# Patient Record
Sex: Female | Born: 1980 | Race: White | Hispanic: No | Marital: Single | Smoking: Heavy tobacco smoker
Health system: Southern US, Community
[De-identification: ages and names within clinical notes are randomized; demographics above are authoritative.]

## PROBLEM LIST (undated history)

## (undated) DIAGNOSIS — E512 Wernicke's encephalopathy: Secondary | ICD-10-CM

## (undated) DIAGNOSIS — M609 Myositis, unspecified: Secondary | ICD-10-CM

## (undated) DIAGNOSIS — B1920 Unspecified viral hepatitis C without hepatic coma: Secondary | ICD-10-CM

## (undated) DIAGNOSIS — I38 Endocarditis, valve unspecified: Secondary | ICD-10-CM

## (undated) DIAGNOSIS — F1910 Other psychoactive substance abuse, uncomplicated: Secondary | ICD-10-CM

## (undated) DIAGNOSIS — Z8543 Personal history of malignant neoplasm of ovary: Secondary | ICD-10-CM

## (undated) HISTORY — PX: TUBAL LIGATION: SHX77

## (undated) HISTORY — PX: TONSILLECTOMY: SUR1361

---
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
## 2005-01-19 ENCOUNTER — Ambulatory Visit: Payer: Self-pay | Admitting: Infectious Diseases

## 2015-03-31 ENCOUNTER — Encounter (HOSPITAL_BASED_OUTPATIENT_CLINIC_OR_DEPARTMENT_OTHER): Payer: Self-pay

## 2015-03-31 ENCOUNTER — Emergency Department (HOSPITAL_BASED_OUTPATIENT_CLINIC_OR_DEPARTMENT_OTHER): Payer: Self-pay

## 2015-03-31 ENCOUNTER — Emergency Department (HOSPITAL_BASED_OUTPATIENT_CLINIC_OR_DEPARTMENT_OTHER)
Admission: EM | Admit: 2015-03-31 | Discharge: 2015-03-31 | Disposition: A | Discharge status: Home / Self Care | Payer: Self-pay | Attending: Emergency Medicine | Admitting: Emergency Medicine

## 2015-03-31 DIAGNOSIS — Y9289 Other specified places as the place of occurrence of the external cause: Secondary | ICD-10-CM | POA: Insufficient documentation

## 2015-03-31 DIAGNOSIS — S63602A Unspecified sprain of left thumb, initial encounter: Secondary | ICD-10-CM | POA: Insufficient documentation

## 2015-03-31 DIAGNOSIS — Y998 Other external cause status: Secondary | ICD-10-CM | POA: Insufficient documentation

## 2015-03-31 DIAGNOSIS — Y9389 Activity, other specified: Secondary | ICD-10-CM | POA: Insufficient documentation

## 2015-03-31 DIAGNOSIS — W228XXA Striking against or struck by other objects, initial encounter: Secondary | ICD-10-CM | POA: Insufficient documentation

## 2015-03-31 DIAGNOSIS — Z720 Tobacco use: Secondary | ICD-10-CM | POA: Insufficient documentation

## 2015-03-31 DIAGNOSIS — S63619A Unspecified sprain of unspecified finger, initial encounter: Secondary | ICD-10-CM

## 2015-03-31 MED ORDER — CYCLOBENZAPRINE HCL 10 MG PO TABS: 10.0000 mg | ORAL_TABLET | Freq: Two times a day (BID) | ORAL | Status: DC | PRN

## 2015-03-31 NOTE — ED Notes (Signed)
Pt reports left thumb and hand injury x2 days ago while moving furniture, reports a dresser hit it.

## 2015-03-31 NOTE — ED Notes (Signed)
Capillary refill WNL of left thumb and other digits

## 2015-03-31 NOTE — ED Provider Notes (Signed)
CSN: [id number]     Arrival date & time 03/31/15  1525 History   First MD Initiated Contact with Patient 03/31/15 1705     Chief Complaint  Patient presents with  . Finger Injury     (Consider location/radiation/quality/duration/timing/severity/associated sxs/prior Treatment) HPI  Blood pressure 105/65, pulse 81, temperature 98.4 F (36.9 C), temperature source Oral, resp. rate 16, height  (1.651 m), weight 117 lb (53.071 kg), last menstrual period 03/04/2015, SpO2 100 %.  Mackenzie Hughes is a 34 y.o. female complaining of severe, 8 out of 10 left thumb pain after patient impacted the hand in between the pouch and a wall 2 days ago. Patient states that she is right-hand dominant, she's been taking over-the-counter pain medications at home with little relief, states that she is losing sleep because the pain is so severe.  History reviewed. No pertinent past medical history. Past Surgical History  Procedure Laterality Date  . Tonsillectomy     History reviewed. No pertinent family history. History  Substance Use Topics  . Smoking status: Light Tobacco Smoker  . Smokeless tobacco: Not on file  . Alcohol Use: No   OB History    No data available     Review of Systems  10 systems reviewed and found to be negative, except as noted in the HPI.  Allergies  Toradol and Tramadol  Home Medications   Prior to Admission medications   Not on File   BP 165/110 mmHg  Pulse 85  Temp(Src) 98.4 F (36.9 C) (Oral)  Resp 16  Ht  (1.651 m)  Wt 117 lb (53.071 kg)  BMI 19.47 kg/m2  SpO2 99%  LMP 03/04/2015 Physical Exam  Constitutional: She is oriented to person, place, and time. She appears well-developed and well-nourished. No distress.  HENT:  Head: Normocephalic.  Eyes: Conjunctivae and EOM are normal.  Cardiovascular: Normal rate.   Pulmonary/Chest: Effort normal. No stridor.  Musculoskeletal: Normal range of motion. She exhibits tenderness. She exhibits no  edema.  Left thumb/hand with no deformity, no lacerations, ecchymoses. Full range of motion to the thumb, no significant swelling. Patient is diffusely tender to palpation along the thumb and first metacarpal. Distally neurovascular intact with cap refill less than 2 seconds and ability to differentiate between pinprick and light touch.  Neurological: She is alert and oriented to person, place, and time.  Psychiatric: She has a normal mood and affect.  Nursing note and vitals reviewed.   ED Course  Procedures (including critical care time) Labs Review Labs Reviewed - No data to display  Imaging Review Dg Hand Complete Left  03/31/2015   CLINICAL DATA:  Pain. Trauma to thumb several days ago ; thumb was caught between wall and dresser  EXAM: LEFT HAND - COMPLETE 3+ VIEW  COMPARISON:  None.  FINDINGS: Frontal, oblique, and lateral views obtained. There is no fracture or dislocation. Joint spaces appear intact. No erosive change.  IMPRESSION: No fracture or dislocation.  No appreciable arthropathy.   Electronically Signed   By: Bretta Bang III M.D.   On: 03/31/2015 15:53     EKG Interpretation None      MDM   Final diagnoses:  Finger sprain, initial encounter    Filed Vitals:   03/31/15 1529 03/31/15 1717  BP: 165/110 105/65  Pulse: 85 81  Temp: 98.4 F (36.9 C)   TempSrc: Oral   Resp: 16 16  Height:  (1.651 m)   Weight: 117 lb (53.071 kg)  SpO2: 99% 100%    Mackenzie Hughes is a pleasant 34 y.o. female presenting with Left thumb pain after patient impacted the hand in between the couch and wall several days ago. No overlying lacerations. Neurovascularly intact with mild tenderness to palpation. X-rays negative. Will treat with rest, ice, compression elevation, NSAIDS. Patient given wrist splint and an orthopedic follow-up.   Evaluation does not show pathology that would require ongoing emergent intervention or inpatient treatment. Pt is hemodynamically stable and  mentating appropriately. Discussed findings and plan with patient/guardian, who agrees with care plan. All questions answered. Return precautions discussed and outpatient follow up given.   Discharge Medication List as of 03/31/2015  5:13 PM    START taking these medications   Details  cyclobenzaprine (FLEXERIL) 10 MG tablet Take 1 tablet (10 mg total) by mouth 2 (two) times daily as needed for muscle spasms., Starting 03/31/2015, Until Discontinued, [employer], PA-C 04/01/15 0041  Jerelyn Scott, MD 04/02/15 [phone]

## 2015-03-31 NOTE — ED Notes (Signed)
injuried left hand/thumb Friday afternoon, states hand became lodged between a piece of furniture and the wall, mild swelling noted to injury site

## 2015-03-31 NOTE — Discharge Instructions (Signed)
Rest, Ice intermittently (in the first 24-48 hours), Gentle compression with an Ace wrap, and elevate (Limb above the level of the heart)  For pain control you may take up to 800mg  of ibuprofen (that is usually 4 over the counter pills)  3 times a day (take with food) and acetaminophen 975mg  (this is 3 over the counter pills) four times a day. Do not drink alcohol or combine with other medications that have acetaminophen as an ingredient (Read the labels!).    For breakthrough pain you may take Flexeril. Do not drink alcohol, drive or operate heavy machinery when taking Flexeril.  Do not hesitate to return to the emergency room for any new, worsening or concerning symptoms.  Please obtain primary care using resource guide below. Let them know that you were seen in the emergency room and that they will need to obtain records for further outpatient management.   Finger Sprain A finger sprain is a tear in one of the strong, fibrous tissues that connect the bones (ligaments) in your finger. The severity of the sprain depends on how much of the ligament is torn. The tear can be either partial or complete. CAUSES  Often, sprains are a result of a fall or accident. If you extend your hands to catch an object or to protect yourself, the force of the impact causes the fibers of your ligament to stretch too much. This excess tension causes the fibers of your ligament to tear. SYMPTOMS  You may have some loss of motion in your finger. Other symptoms include:  Bruising.  Tenderness.  Swelling. DIAGNOSIS  In order to diagnose finger sprain, your caregiver will physically examine your finger or thumb to determine how torn the ligament is. Your caregiver may also suggest an X-ray exam of your finger to make sure no bones are broken. TREATMENT  If your ligament is only partially torn, treatment usually involves keeping the finger in a fixed position (immobilization) for a short period. To do this, your  caregiver will apply a bandage, cast, or splint to keep your finger from moving until it heals. For a partially torn ligament, the healing process usually takes 2 to 3 weeks. If your ligament is completely torn, you may need surgery to reconnect the ligament to the bone. After surgery a cast or splint will be applied and will need to stay on your finger or thumb for 4 to 6 weeks while your ligament heals. HOME CARE INSTRUCTIONS  Keep your injured finger elevated, when possible, to decrease swelling.  To ease pain and swelling, apply ice to your joint twice a day, for 2 to 3 days:  Put ice in a plastic bag.  Place a towel between your skin and the bag.  Leave the ice on for 15 minutes.  Only take over-the-counter or prescription medicine for pain as directed by your caregiver.  Do not wear rings on your injured finger.  Do not leave your finger unprotected until pain and stiffness go away (usually 3 to 4 weeks).  Do not allow your cast or splint to get wet. Cover your cast or splint with a plastic bag when you shower or bathe. Do not swim.  Your caregiver may suggest special exercises for you to do during your recovery to prevent or limit permanent stiffness. SEEK IMMEDIATE MEDICAL CARE IF:  Your cast or splint becomes damaged.  Your pain becomes worse rather than better. MAKE SURE YOU:  Understand these instructions.  Will watch your condition.  Will get help right away if you are not doing well or get worse. Document Released: 10/01/2004 Document Revised: 11/16/2011 Document Reviewed: 04/27/2011 [facility] Patient Information 2015 [place], [place]. This information is not intended to replace advice given to you by your health care provider. Make sure you discuss any questions you have with your health care provider.   Emergency Department Resource Guide 1) Find a Doctor and Pay Out of Pocket Although you won't have to find out who is covered by your insurance plan, it is a good  idea to ask around and get recommendations. You will then need to call the office and see if the doctor you have chosen will accept you as a new patient and what types of options they offer for patients who are self-pay. Some doctors offer discounts or will set up payment plans for their patients who do not have insurance, but you will need to ask so you aren't surprised when you get to your appointment.  2) Contact Your Local Health Department Not all health departments have doctors that can see patients for sick visits, but many do, so it is worth a call to see if yours does. If you don't know where your local health department is, you can check in your phone book. The CDC also has a tool to help you locate your state's health department, and many state websites also have listings of all of their local health departments.  3) Find a Walk-in Clinic If your illness is not likely to be very severe or complicated, you may want to try a walk in clinic. These are popping up all over the country in pharmacies, drugstores, and shopping centers. They're usually staffed by nurse practitioners or physician assistants that have been trained to treat common illnesses and complaints. They're usually fairly quick and inexpensive. However, if you have serious medical issues or chronic medical problems, these are probably not your best option.  No Primary Care Doctor: - Call Health Connect at  [phone] - they can help you locate a primary care doctor that  accepts your insurance, provides certain services, etc. - Physician Referral Service- [phone]  Chronic Pain Problems: Organization         Address  Phone   Notes  [facility]  [phone] Patients need to be referred by their primary care doctor.   Medication Assistance: Organization         Address  Phone   Notes  [facility] Medication [facility] [address] [place] [place], [place] [address] [phone]  --Must be a resident of [facility] -- Must have NO insurance coverage whatsoever (no Medicaid/ Medicare, etc.) -- The pt. MUST have a primary care doctor that directs their care regularly and follows them in the community   [facility]ist  [phone]   [employer]  [phone]    Agencies that provide inexpensive medical care: Organization         Address  Phone   Notes  [facility]  [phone]   [facility]    [phone]   [facility] [address] [place], [place] [address] [phone]   [facility] [address], [place] [phone]   Planned Parenthood    [phone]   [facility]    [phone]   [facility]  [address], [place] Phone:  [phone],  Fax:  [phone] Hours of Operation:  9 am - 6 pm, M-F.  Also accepts Medicaid/Medicare and self-pay.  [facility]  [address], [place] Phone: [phone], Fax: [phone]. Hours of Operation:  8:30 am - 5:30 pm, M-F.  Also accepts Medicaid and self-pay.  [facility] [place] [address] [place] Phone: [phone]   [facility] [address] Natasha Bence [place], [place] [phone], Ext. [phone] Mondays & Thursdays: 7-9 AM.  First 15 patients are seen on a first come, first serve basis.    Medicaid-accepting [facility] Providers:  Organization         Address  Phone   Notes  [facility] [address], [place] [phone] Also accepts self-pay patients.  [facility] [address] Laurell Josephs [place], [place]  [phone]   [facility] [address], [place] [phone]   [facility] [address] [place] [phone]   Renaye Rakers [address], [place]   [phone] Only  accepts [place] Access [place] patients after they have their name applied to their card.   Self-Pay (no insurance) in [facility]:  Organization         Address  Phone   Notes  Sickle Cell Patients, [facility] Internal Medicine [address] [place], [place] [phone]   [facility] [address] [place], [place] [phone]   [facility] [place]  [address], [place] [phone]   [facility]/Dr. Osei-Bonsu  [address], [place] or [address], [place] [phone] Phone number for both [place] and [place] locations is the same.  [facility] [address] [place] [phone]   [facility] [address], [place] or [address] [phone]   [facility] [address] [place] [phone], phone; [phone], fax Sees patients 1st and 3rd Saturday of every month.  Must not qualify for public or private insurance (i.e. Medicaid, Medicare, [place] Health Choice, Veterans' Benefits)  Household income should be no more than 200% of the poverty level The clinic cannot treat you if you are pregnant or think you are pregnant  Sexually transmitted diseases are not treated at the clinic.    Dental Care: Organization         Address  Phone  Notes  [facility] of [facility] [address] [place], [place] [phone] Accepts children up to age 64 who are enrolled in [place] or [place] Health Choice; pregnant women with a Medicaid card; and children who have applied for Medicaid or [place] Health Choice, but were declined, whose parents can pay a reduced fee at time of service.  [facility] Department of [facility]  [address], [place] [phone] Accepts children up to age 17 who are enrolled in [place] or [place] Health Choice; pregnant  women with a Medicaid card; and children who have applied for Medicaid or [place] Health Choice, but were declined, whose parents can pay a reduced fee at time of service.  [facility]  [address] [place], [place] [phone] Patients are seen by appointment only. Walk-ins are not  accepted. [facility] will see patients 11 years of age and older. Monday - Tuesday (8am-5pm) Most Wednesdays (8:30-5pm) $30 per visit, cash only  [facility] Adult Dental Access [facility]  [address], [facility] [phone] Patients are seen by appointment only. Walk-ins are not accepted. [facility] will see patients 14 years of age and older. One Wednesday Evening (Monthly: Volunteer Based).  $30 per visit, cash only  [employer]  [phone] for adults; Children under age 49, call Graduate Pediatric Dentistry at [phone]. Children aged 32-14, please call [phone] to request a pediatric application.  Dental services are provided in all areas of dental care including fillings, crowns and bridges, complete and partial dentures, implants, gum treatment, root canals, and extractions. Preventive care is also provided. Treatment is provided to both adults and children. Patients are selected via a lottery and there is often a waiting list.   [facility] [address], [place]  [phone] [email or web address]   [facility] [address] [place], [place] [phone], Ext. [phone] Second and Fourth Thursday of each month, opens at 6:30 AM; Clinic ends at 9 AM.  Patients are seen on a first-come first-served basis, and a limited number are seen during each clinic.   [facility]  [address] Ether Griffins [place], [place] [phone]   Eligibility Requirements You must have lived in [place], [place], or [place] counties for at least the last three months.   You cannot be eligible for state or federal sponsored  [place], including [employer], [place], or [employer].   You generally cannot be eligible for healthcare insurance through your employer.    How to apply: Eligibility screenings are held every Tuesday and Wednesday afternoon from 1:00 pm until 4:00 pm. You do not need an appointment for the interview!  [facility] [address], [place], [place] [phone]   [facility] Health Department  [phone]   [facility] Health Department  [phone]   [facility] Health Department  [phone]    Behavioral Health Resources in the Community: Intensive Outpatient Programs Organization         Address  Phone  Notes  [facility] Services [address] [place], [place] [phone]   [facility] [address] [place], [place] [phone]   [facility] [address], [place], [place]  [phone]   [facility] Mental Health [address]  [place], [place] [phone] or [phone]   Substance Abuse Resources Organization         Address  Phone  Notes  [facility] Drug Services  [phone]   [facility]  [phone]   [facility] [place]  [phone]   Floydene Flock  [phone]   Residential & Outpatient Substance Abuse Program  [phone]   Psychological Services Organization         Address  Phone  Notes  [facility]  [phone]   [facility]  [phone]   [facility] Mental Health [address], [place] [phone] or [phone]    Mobile Crisis Teams Organization         Address  Phone  Notes  [facility], Mobile Crisis Care Unit  [phone]   [facility]  [address] [place], [place] [phone]   [facility] [address] [place] [phone]    Self-Help/Support Groups Organization  Address  Phone              Notes  [facility] [place] - variety of support groups  336- I7437963 Call for more information  Narcotics Anonymous (NA), [facility] [address], [employer] [place]  2 meetings at this location   Statistician         Address  Phone  Notes  [facility] [address],    [place]  [phone]   [facility]  [address], [place] [address], [place], [place] [phone]   [facility] Treatment Facility [address] [place], [place] [phone] Admissions: 8am-3pm M-F  [facility] [address],    [place], [place] [phone]   [facility] [address] [place], [place], [place] [phone]   [facility] [address],  [place], [place] [phone]   [facility] - Intensive Outpatient [address] Laurell Josephs [address], [place], [place] [phone]   [facility] ([facility] [address] [place]  [place], [place] [phone] or [phone]   [facility] ([facility]) [address] [place], [place] [phone] Accepts Medicaid  [facility] [address],  [place] [phone] Substance Abuse/Addiction Treatment   [facility] Organization         Address  Phone  Notes  [facility]  [phone]   Angie Fava, PhD [address] Ervin Knack [place], [place]   [phone] or [phone]   [facility]   [address] [place], [place] [phone]   [facility] [address], [place], [place] [phone] Insurance/Medicaid/sponsorship through [facility] and Families [address]                                    [place], [place] [phone] Therapy/tele-psych/case  [facility] [address], [place] [phone]    Dr. Lolly Mustache  [phone]   [facility]  [employer] [facility] Dept. 1) [address], [place] 2) [address] [place] 3)  [address], [place] [phone]  [phone]   [facility] Child Abuse Hotline 412-267-8258 or [phone] (After Hours)

## 2015-03-31 NOTE — ED Notes (Signed)
Comfort measures provided with ice pack and elevation to injury site

## 2016-01-20 ENCOUNTER — Encounter (HOSPITAL_BASED_OUTPATIENT_CLINIC_OR_DEPARTMENT_OTHER): Payer: Self-pay | Admitting: *Deleted

## 2016-01-20 DIAGNOSIS — L0231 Cutaneous abscess of buttock: Secondary | ICD-10-CM | POA: Insufficient documentation

## 2016-01-20 DIAGNOSIS — F17200 Nicotine dependence, unspecified, uncomplicated: Secondary | ICD-10-CM | POA: Insufficient documentation

## 2016-01-20 NOTE — ED Notes (Signed)
Pt c/o abscess to left buttocks x 3 days

## 2016-01-21 ENCOUNTER — Emergency Department (HOSPITAL_BASED_OUTPATIENT_CLINIC_OR_DEPARTMENT_OTHER)
Admission: EM | Admit: 2016-01-21 | Discharge: 2016-01-21 | Disposition: A | Discharge status: Home / Self Care | Payer: Self-pay | Attending: Emergency Medicine | Admitting: Emergency Medicine

## 2016-01-21 DIAGNOSIS — L0231 Cutaneous abscess of buttock: Secondary | ICD-10-CM

## 2016-01-21 LAB — CBC WITH DIFFERENTIAL/PLATELET
Basophils Absolute: 0 10*3/uL (ref 0.0–0.1)
Basophils Relative: 0 %
EOS ABS: 0.1 10*3/uL (ref 0.0–0.7)
EOS PCT: 1 %
HCT: 32.3 % — ABNORMAL LOW (ref 36.0–46.0)
Hemoglobin: 11 g/dL — ABNORMAL LOW (ref 12.0–15.0)
LYMPHS ABS: 1 10*3/uL (ref 0.7–4.0)
Lymphocytes Relative: 14 %
MCH: 30.5 pg (ref 26.0–34.0)
MCHC: 34.1 g/dL (ref 30.0–36.0)
MCV: 89.5 fL (ref 78.0–100.0)
Monocytes Absolute: 0.5 10*3/uL (ref 0.1–1.0)
Monocytes Relative: 7 %
Neutro Abs: 5.5 10*3/uL (ref 1.7–7.7)
Neutrophils Relative %: 78 %
Platelets: 141 10*3/uL — ABNORMAL LOW (ref 150–400)
RBC: 3.61 MIL/uL — ABNORMAL LOW (ref 3.87–5.11)
RDW: 14.7 % (ref 11.5–15.5)
WBC: 7 10*3/uL (ref 4.0–10.5)

## 2016-01-21 LAB — BASIC METABOLIC PANEL
Anion gap: 8 (ref 5–15)
BUN: 17 mg/dL (ref 6–20)
CALCIUM: 8.7 mg/dL — AB (ref 8.9–10.3)
CO2: 25 mmol/L (ref 22–32)
Chloride: 99 mmol/L — ABNORMAL LOW (ref 101–111)
Creatinine, Ser: 0.63 mg/dL (ref 0.44–1.00)
GFR calc Af Amer: >60 mL/min (ref >60)
GFR calc non Af Amer: >60 mL/min (ref >60)
GLUCOSE: 132 mg/dL — AB (ref 65–99)
Potassium: 3.5 mmol/L (ref 3.5–5.1)
SODIUM: 132 mmol/L — AB (ref 135–145)

## 2016-01-21 LAB — I-STAT CG4 LACTIC ACID, ED: Lactic Acid, Venous: 1.03 mmol/L (ref 0.5–2.0)

## 2016-01-21 MED ORDER — HYDROMORPHONE HCL 1 MG/ML IJ SOLN
0.5000 mg | Freq: Once | INTRAMUSCULAR | Status: AC
  Administered 2016-01-21: 0.5 mg via INTRAVENOUS
  Filled 2016-01-21: qty 1

## 2016-01-21 MED ORDER — SODIUM CHLORIDE 0.9 % IV BOLUS (SEPSIS)
2000.0000 mL | Freq: Once | INTRAVENOUS | Status: AC
  Administered 2016-01-21: 2000 mL via INTRAVENOUS

## 2016-01-21 MED ORDER — SULFAMETHOXAZOLE-TRIMETHOPRIM 800-160 MG PO TABS: 1.0000 | ORAL_TABLET | Freq: Two times a day (BID) | ORAL | Status: DC

## 2016-01-21 MED ORDER — LIDOCAINE-EPINEPHRINE 2 %-1:100000 IJ SOLN
20.0000 mL | Freq: Once | INTRAMUSCULAR | Status: AC
  Administered 2016-01-21: 20 mL via INTRADERMAL
  Filled 2016-01-21: qty 1

## 2016-01-21 NOTE — ED Notes (Signed)
Per VO of PA at bedside, we will not draw the second blood culture at this time.  Will wait for the other results and decide whether to go ahead with the second blood culture.

## 2016-01-21 NOTE — ED Notes (Signed)
PA at bedside doing abscess I&D.

## 2016-01-21 NOTE — ED Provider Notes (Signed)
CSN: [id number]     Arrival date & time 01/20/16  2333 History   First MD Initiated Contact with Patient 01/21/16 0010     Chief Complaint  Patient presents with  . Abscess     (Consider location/radiation/quality/duration/timing/severity/associated sxs/prior Treatment) HPI   Blood pressure 102/64, pulse 130, temperature 99.6 F (37.6 C), resp. rate 16, height 5\' 5"  (1.651 m), weight 49.896 kg, last menstrual period 12/25/2015, SpO2 96 %.  [name] is a 35 y.o. female complaining of painful swelling to left buttocks onset 3 days ago. No known trauma to the area. She reports tactile fever and chills with no nausea or vomiting. Pain is severe, 10 out of 10 and exacerbated by palpation, no alleviating factors identified.  History reviewed. No pertinent past medical history. Past Surgical History  Procedure Laterality Date  . Tonsillectomy    . Tubal ligation     History reviewed. No pertinent family history. Social History  Substance Use Topics  . Smoking status: Heavy Tobacco Smoker -- 0.50 packs/day  . Smokeless tobacco: None  . Alcohol Use: No   OB History    No data available     Review of Systems  10 systems reviewed and found to be negative, except as noted in the HPI.   Allergies  Toradol and Tramadol  Home Medications   Prior to Admission medications   Medication Sig Start Date End Date Taking? Authorizing Provider  sulfamethoxazole-trimethoprim (BACTRIM DS) 800-160 MG tablet Take 1 tablet by mouth 2 (two) times daily. 01/21/16   Krystalynn Ridgeway, PA-C   BP 91/56 mmHg  Pulse 105  Temp(Src) 99.6 F (37.6 C)  Resp 16  Ht 5\' 5"  (1.651 m)  Wt 49.896 kg  BMI 18.31 kg/m2  SpO2 100%  LMP 12/25/2015 Physical Exam  Constitutional: She is oriented to person, place, and time. She appears well-developed. No distress.  Cachectic, appears much older than stated age  HENT:  Head: Normocephalic.  Eyes: Conjunctivae and EOM are normal.  Cardiovascular:  Regular rhythm and intact distal pulses.   No murmur heard. Tachycardic  Pulmonary/Chest: Effort normal and breath sounds normal. No stridor.  Abdominal: Soft. Bowel sounds are normal.  Genitourinary:     Musculoskeletal: Normal range of motion.  Neurological: She is alert and oriented to person, place, and time.  Skin:  Track marks to right volar forearm, no overt infection  Psychiatric: She has a normal mood and affect.  Nursing note and vitals reviewed.   ED Course  .Marland Kitchen.Incision and Drainage Date/Time: 01/21/2016 1:08 AM Performed by: Wynetta EmeryPISCIOTTA, Leasia Swann Authorized by: Wynetta EmeryPISCIOTTA, Dashea Mcmullan Consent: Verbal consent obtained. Risks and benefits: risks, benefits and alternatives were discussed Consent given by: patient Patient identity confirmed: verbally with patient Type: abscess Body area: trunk Local anesthetic: lidocaine 2% without epinephrine Anesthetic total: 3 ml Patient sedated: no Scalpel size: 11 Incision type: elliptical Incision depth: dermal Drainage: purulent and  bloody Drainage amount: scant Wound treatment: wound left open Packing material: none Patient tolerance: Patient tolerated the procedure well with no immediate complications   (including critical care time) Labs Review Labs Reviewed  CBC WITH DIFFERENTIAL/PLATELET - Abnormal; Notable for the following:    RBC 3.61 (*)    Hemoglobin 11.0 (*)    HCT 32.3 (*)    Platelets 141 (*)    All other components within normal limits  CULTURE, BLOOD (ROUTINE X 2)  CULTURE, BLOOD (ROUTINE X 2)  BASIC METABOLIC PANEL  I-STAT CG4 LACTIC ACID, ED  Imaging Review No results found. I have personally reviewed and evaluated these images and lab results as part of my medical decision-making.   EKG Interpretation None      MDM   Final diagnoses:  Gluteal abscess    Filed Vitals:   01/20/16 2340 01/21/16 0113  BP: 102/64 91/56  Pulse: 130 105  Temp: 99.6 F (37.6 C)   Resp: 16 16  Height:   (1.651 m)   Weight: 49.896 kg   SpO2: 96% 100%    Medications  sodium chloride 0.9 % bolus 2,000 mL (2,000 mLs Intravenous New Bag/Given 01/21/16 0059)  lidocaine-EPINEPHrine (XYLOCAINE W/EPI) 2 %-1:100000 (with pres) injection 20 mL (20 mLs Intradermal Given 01/21/16 0059)  HYDROmorphone (DILAUDID) injection 0.5 mg (0.5 mg Intravenous Given 01/21/16 0100)    Mackenzie Hughes is 35 y.o. female presenting with  Abscess and mild surrounding cellulitis to right gluteus. Does not approach the anal verge. Pt tachycardiac to 130 but not febrile. Will obtain labs, Dowbt sepsis I think she may be withdrawing. Blood work reassuring. Tachycardia has improved to 105. Patient reports improvement in symptoms, counseled her on sitz bath and return precautions.  Given this patient's reassuring lab work and the difficulty in obtaining a second blood culture have given verbal okay for single blood culture to be drawn.  Evaluation does not show pathology that would require ongoing emergent intervention or inpatient treatment. Pt is hemodynamically stable and mentating appropriately. Discussed findings and plan with patient/guardian, who agrees with care plan. All questions answered. Return precautions discussed and outpatient follow up given.   New Prescriptions   SULFAMETHOXAZOLE-TRIMETHOPRIM (BACTRIM DS) 800-160 MG TABLET    Take 1 tablet by mouth 2 (two) times daily.         Wynetta Emery, PA-C 01/21/16 0120  Wynetta Emery, PA-C 01/21/16 0121  Cy Blamer, MD 01/21/16 8295

## 2016-01-21 NOTE — Discharge Instructions (Signed)
If you see worsening signs of infection (warmth, redness, tenderness, pus, sharp increase in pain, fever, red streaking) immediately return to the emergency department.  Please follow with your primary care doctor in the next 2 days for a check-up. They must obtain records for further management.   Do not hesitate to return to the Emergency Department for any new, worsening or concerning symptoms.    Incision and Drainage Incision and drainage is a procedure in which a sac-like structure (cystic structure) is opened and drained. The area to be drained usually contains material such as pus, fluid, or blood.  LET YOUR CAREGIVER KNOW ABOUT:   Allergies to medicine.  Medicines taken, including vitamins, herbs, eyedrops, over-the-counter medicines, and creams.  Use of steroids (by mouth or creams).  Previous problems with anesthetics or numbing medicines.  History of bleeding problems or blood clots.  Previous surgery.  Other health problems, including diabetes and kidney problems.  Possibility of pregnancy, if this applies. RISKS AND COMPLICATIONS  Pain.  Bleeding.  Scarring.  Infection. BEFORE THE PROCEDURE  You may need to have an ultrasound or other imaging tests to see how large or deep your cystic structure is. Blood tests may also be used to determine if you have an infection or how severe the infection is. You may need to have a tetanus shot. PROCEDURE  The affected area is cleaned with a cleaning fluid. The cyst area will then be numbed with a medicine (local anesthetic). A small incision will be made in the cystic structure. A syringe or catheter may be used to drain the contents of the cystic structure, or the contents may be squeezed out. The area will then be flushed with a cleansing solution. After cleansing the area, it is often gently packed with a gauze or another wound dressing. Once it is packed, it will be covered with gauze and tape or some other type of wound  dressing. AFTER THE PROCEDURE   Often, you will be allowed to go home right after the procedure.  You may be given antibiotic medicine to prevent or heal an infection.  If the area was packed with gauze or some other wound dressing, you will likely need to come back in 1 to 2 days to get it removed.  The area should heal in about 14 days.   This information is not intended to replace advice given to you by your health care provider. Make sure you discuss any questions you have with your health care provider.   Document Released: 02/17/2001 Document Revised: 02/23/2012 Document Reviewed: 10/19/2011 Elsevier Interactive Patient Education [employer]2016 Elsevier Inc.

## 2016-01-26 LAB — CULTURE, BLOOD (ROUTINE X 2): Culture: NO GROWTH

## 2018-03-04 DIAGNOSIS — A4102 Sepsis due to Methicillin resistant Staphylococcus aureus: Secondary | ICD-10-CM

## 2018-03-04 DIAGNOSIS — E871 Hypo-osmolality and hyponatremia: Secondary | ICD-10-CM

## 2018-03-04 DIAGNOSIS — R509 Fever, unspecified: Secondary | ICD-10-CM

## 2018-03-04 DIAGNOSIS — E46 Unspecified protein-calorie malnutrition: Secondary | ICD-10-CM

## 2018-03-04 DIAGNOSIS — R6521 Severe sepsis with septic shock: Secondary | ICD-10-CM

## 2018-03-04 DIAGNOSIS — R05 Cough: Secondary | ICD-10-CM

## 2018-03-04 DIAGNOSIS — I2690 Septic pulmonary embolism without acute cor pulmonale: Secondary | ICD-10-CM

## 2018-03-04 DIAGNOSIS — F1123 Opioid dependence with withdrawal: Secondary | ICD-10-CM

## 2018-03-04 DIAGNOSIS — M726 Necrotizing fasciitis: Secondary | ICD-10-CM

## 2018-03-06 ENCOUNTER — Inpatient Hospital Stay (HOSPITAL_COMMUNITY)
Admission: AD | Admit: 2018-03-06 | Discharge: 2018-03-07 | DRG: 557 | Discharge status: Against Medical Advice | Payer: Self-pay | Source: Other Acute Inpatient Hospital | Attending: Internal Medicine | Admitting: Internal Medicine

## 2018-03-06 DIAGNOSIS — F17210 Nicotine dependence, cigarettes, uncomplicated: Secondary | ICD-10-CM | POA: Diagnosis present

## 2018-03-06 DIAGNOSIS — Z885 Allergy status to narcotic agent status: Secondary | ICD-10-CM

## 2018-03-06 DIAGNOSIS — F1990 Other psychoactive substance use, unspecified, uncomplicated: Secondary | ICD-10-CM

## 2018-03-06 DIAGNOSIS — A419 Sepsis, unspecified organism: Secondary | ICD-10-CM | POA: Diagnosis present

## 2018-03-06 DIAGNOSIS — I340 Nonrheumatic mitral (valve) insufficiency: Secondary | ICD-10-CM | POA: Diagnosis present

## 2018-03-06 DIAGNOSIS — I2690 Septic pulmonary embolism without acute cor pulmonale: Secondary | ICD-10-CM | POA: Diagnosis present

## 2018-03-06 DIAGNOSIS — M728 Other fibroblastic disorders: Principal | ICD-10-CM | POA: Diagnosis present

## 2018-03-06 DIAGNOSIS — D649 Anemia, unspecified: Secondary | ICD-10-CM | POA: Diagnosis present

## 2018-03-06 DIAGNOSIS — B182 Chronic viral hepatitis C: Secondary | ICD-10-CM | POA: Diagnosis present

## 2018-03-06 DIAGNOSIS — M25512 Pain in left shoulder: Secondary | ICD-10-CM | POA: Diagnosis present

## 2018-03-06 DIAGNOSIS — Z8543 Personal history of malignant neoplasm of ovary: Secondary | ICD-10-CM

## 2018-03-06 DIAGNOSIS — M549 Dorsalgia, unspecified: Secondary | ICD-10-CM | POA: Diagnosis present

## 2018-03-06 DIAGNOSIS — M25511 Pain in right shoulder: Secondary | ICD-10-CM

## 2018-03-06 DIAGNOSIS — I330 Acute and subacute infective endocarditis: Secondary | ICD-10-CM | POA: Diagnosis present

## 2018-03-06 DIAGNOSIS — Z452 Encounter for adjustment and management of vascular access device: Secondary | ICD-10-CM

## 2018-03-06 DIAGNOSIS — E320 Persistent hyperplasia of thymus: Secondary | ICD-10-CM | POA: Diagnosis present

## 2018-03-06 DIAGNOSIS — B9561 Methicillin susceptible Staphylococcus aureus infection as the cause of diseases classified elsewhere: Secondary | ICD-10-CM | POA: Diagnosis present

## 2018-03-06 DIAGNOSIS — Z9089 Acquired absence of other organs: Secondary | ICD-10-CM

## 2018-03-06 DIAGNOSIS — Z5321 Procedure and treatment not carried out due to patient leaving prior to being seen by health care provider: Secondary | ICD-10-CM | POA: Diagnosis present

## 2018-03-06 DIAGNOSIS — I058 Other rheumatic mitral valve diseases: Secondary | ICD-10-CM | POA: Diagnosis present

## 2018-03-06 DIAGNOSIS — G8929 Other chronic pain: Secondary | ICD-10-CM | POA: Diagnosis present

## 2018-03-06 DIAGNOSIS — Z720 Tobacco use: Secondary | ICD-10-CM | POA: Diagnosis present

## 2018-03-06 DIAGNOSIS — R7881 Bacteremia: Secondary | ICD-10-CM

## 2018-03-06 DIAGNOSIS — B9562 Methicillin resistant Staphylococcus aureus infection as the cause of diseases classified elsewhere: Secondary | ICD-10-CM | POA: Diagnosis present

## 2018-03-06 DIAGNOSIS — F1120 Opioid dependence, uncomplicated: Secondary | ICD-10-CM | POA: Diagnosis present

## 2018-03-06 DIAGNOSIS — I059 Rheumatic mitral valve disease, unspecified: Secondary | ICD-10-CM | POA: Diagnosis present

## 2018-03-06 DIAGNOSIS — L8990 Pressure ulcer of unspecified site, unspecified stage: Secondary | ICD-10-CM | POA: Diagnosis present

## 2018-03-06 DIAGNOSIS — Z9851 Tubal ligation status: Secondary | ICD-10-CM

## 2018-03-06 DIAGNOSIS — R6521 Severe sepsis with septic shock: Secondary | ICD-10-CM | POA: Diagnosis present

## 2018-03-06 LAB — COMPREHENSIVE METABOLIC PANEL
ALK PHOS: 92 U/L (ref 38–126)
ALT: 21 U/L (ref 0–44)
AST: 35 U/L (ref 15–41)
Albumin: 2 g/dL — ABNORMAL LOW (ref 3.5–5.0)
Anion gap: 7 (ref 5–15)
BILIRUBIN TOTAL: 0.7 mg/dL (ref 0.3–1.2)
BUN: 9 mg/dL (ref 6–20)
CALCIUM: 7.8 mg/dL — AB (ref 8.9–10.3)
CO2: 24 mmol/L (ref 22–32)
CREATININE: 0.47 mg/dL (ref 0.44–1.00)
Chloride: 109 mmol/L (ref 98–111)
GFR calc non Af Amer: >60 mL/min (ref >60)
Glucose, Bld: 103 mg/dL — ABNORMAL HIGH (ref 70–99)
Potassium: 4.9 mmol/L (ref 3.5–5.1)
SODIUM: 140 mmol/L (ref 135–145)
TOTAL PROTEIN: 5.4 g/dL — AB (ref 6.5–8.1)

## 2018-03-06 LAB — CBC
HEMATOCRIT: 29.8 % — AB (ref 36.0–46.0)
HEMOGLOBIN: 9.6 g/dL — AB (ref 12.0–15.0)
MCH: 28.7 pg (ref 26.0–34.0)
MCHC: 32.2 g/dL (ref 30.0–36.0)
MCV: 89.2 fL (ref 78.0–100.0)
Platelets: 207 10*3/uL (ref 150–400)
RBC: 3.34 MIL/uL — ABNORMAL LOW (ref 3.87–5.11)
RDW: 14.2 % (ref 11.5–15.5)
WBC: 7.2 10*3/uL (ref 4.0–10.5)

## 2018-03-06 MED ORDER — KETOROLAC TROMETHAMINE 30 MG/ML IJ SOLN
30.0000 mg | Freq: Four times a day (QID) | INTRAMUSCULAR | Status: DC | PRN
  Administered 2018-03-06 – 2018-03-07 (×3): 30 mg via INTRAVENOUS
  Filled 2018-03-06 (×3): qty 1

## 2018-03-06 MED ORDER — HYDROMORPHONE HCL 1 MG/ML IJ SOLN
1.0000 mg | INTRAMUSCULAR | Status: DC | PRN
  Administered 2018-03-06 – 2018-03-07 (×5): 1 mg via INTRAVENOUS
  Filled 2018-03-06 (×6): qty 1

## 2018-03-06 MED ORDER — KETOROLAC TROMETHAMINE 30 MG/ML IJ SOLN: 30.0000 mg | Freq: Once | INTRAMUSCULAR | Status: DC

## 2018-03-06 MED ORDER — ONDANSETRON HCL 4 MG PO TABS: 4.0000 mg | ORAL_TABLET | Freq: Four times a day (QID) | ORAL | Status: DC | PRN

## 2018-03-06 MED ORDER — ACETAMINOPHEN 650 MG RE SUPP: 650.0000 mg | Freq: Four times a day (QID) | RECTAL | Status: DC | PRN

## 2018-03-06 MED ORDER — ONDANSETRON HCL 4 MG/2ML IJ SOLN: 4.0000 mg | Freq: Four times a day (QID) | INTRAMUSCULAR | Status: DC | PRN

## 2018-03-06 MED ORDER — POLYETHYLENE GLYCOL 3350 17 G PO PACK: 17.0000 g | PACK | Freq: Every day | ORAL | Status: DC | PRN

## 2018-03-06 MED ORDER — SODIUM CHLORIDE 0.9% FLUSH
10.0000 mL | INTRAVENOUS | Status: DC | PRN
  Administered 2018-03-07: 10 mL
  Filled 2018-03-06: qty 40

## 2018-03-06 MED ORDER — VANCOMYCIN HCL IN DEXTROSE 1-5 GM/200ML-% IV SOLN
1000.0000 mg | Freq: Three times a day (TID) | INTRAVENOUS | Status: DC
  Administered 2018-03-06 – 2018-03-07 (×3): 1000 mg via INTRAVENOUS
  Filled 2018-03-06 (×4): qty 200

## 2018-03-06 MED ORDER — TRAMADOL HCL 50 MG PO TABS
50.0000 mg | ORAL_TABLET | Freq: Once | ORAL | Status: AC
Start: 2018-03-06 — End: 2018-03-06
  Administered 2018-03-06: 50 mg via ORAL
  Filled 2018-03-06: qty 1

## 2018-03-06 MED ORDER — ACETAMINOPHEN 325 MG PO TABS
650.0000 mg | ORAL_TABLET | Freq: Four times a day (QID) | ORAL | Status: DC | PRN
  Administered 2018-03-06 – 2018-03-07 (×2): 650 mg via ORAL
  Filled 2018-03-06 (×2): qty 2

## 2018-03-06 MED ORDER — OXYCODONE HCL 5 MG PO TABS: 5.0000 mg | ORAL_TABLET | ORAL | Status: DC | PRN

## 2018-03-06 NOTE — Progress Notes (Addendum)
PROGRESS NOTE  Mackenzie Hughes SEL:[id number] DOB: 05-Aug-1981 DOA: 03/06/2018 PCP: No primary care provider on file.  HPI  Mackenzie Hughes is a 37 y.o. year old female with medical history significant for IV drug use, heroin), hepatitis C (without treatment), and ovarian cancer (2009) who presented to [facility] on 03/03/18 with 5 to 7 days of reported fever generalized arthralgias and myalgias.  She can only remember her last use of IV heroin about a week prior to admission since then she recalled several days of worsening right arm and chest pain which led to her presenting to [facility].  Per chart review she was brought to the ED with complaints of generalized malaise, fatigue, cough, abdominal pain and altered mental status.  Hospital Course (at [facility]) Per chart review she was found to be hypotensive upon arrival with IV fluids given,  central line placed and patient placed on levophed while managed in the ICU.  Her lab values on admission WBC 17.5, hemoglobin 13.1, platelets 134, sodium 130, potassium 3, BUN 30, creatinine 0.6. She was empirically started on vancomycin and Zosyn for sepsis secondary to infectious endocarditis given IV drug use andseptic pulmonary emboli on imaging.  This was confirmed with small vegetation on tricupsid valve on TEE on 03/04/18.   She presented to [facility] as a transfer on 03/06/2018 due to MRI of right shoulder which showed effusion and inflammation of numerous muscles reported concern for infection/fasciitis with no gas in the soft tissues and no drainable abscess, orthopedic specialist at [facility] evaluated imaging and recommended transfer to tertiary care center given concern for possible developing necrotizing fasciitis.  LAB VALUES PRIOR TO TRANSFER on 6/30  WBC- 9, Hgb 9.1, Plt 142 Sodium 138, potassium 3.3, CO2 28, creatinine 0.4, BUN 7, calcium 7.4, bilirubin 0.3, AST 28, ALT 22, alk phos 91  CRP 127  Hep B antigen negative Hepatitis C  antibody positive  On arrival to [facility] patient was hemodynamically stable complaining mainly of right shoulder pain  Subjective Continues to complain of chest and right shoulder pain   Assessment/Plan: Active Problems:   MRSA bacteremia   Acute infective endocarditis   Anemia   Severe sepsis with septic shock (HCC)   Chronic hepatitis C without hepatic coma (HCC)   IV drug user  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  Positive blood cultures 6/28, 6/29 . Blood cultures form 6/30 pending  Unclear how much this is due to subtherapeutic Vanconycin dosing (vanc trough of 6 on 03/05/18).  Pending blood cultures from 6/30 at [facility].  Likely source of infection vegetation on tricuspid valve as confirmed on TEE on 6/28.  Will consult ID. Vanc per pharmacy  Right shoulder pain, likely infectious fasciitis. Arthrocentesis performed on 6/30 by orthopedics: WBC greater than 20000, no organisms on gram stain culture sensitivity, crystals, Gram stain and cell count pending at outside hospital.  MRI imaging at [facility] suggests infectious/fasciitis process.  In conversation with orthopedics doubt true nec fascitis given patient's stable hemodynamics. More likely infectious fascitis and recommend current management with addition of IV toradol for pain  Back pain, worsening.  Unable to perform MRI at outside hospital.  Will obtain MRI of cervical thoracic and lumbar spine to rule out any osteomyelitis/abscess or other localizing infection to explain persistently positive blood cultures.  She is neurologically intact  Septic shock secondary to above, resolved.  Patient previously required levophed at outside hospital from 6/28 with weaning of infusion on the  morning of 6/30 prior to transfer.  Normal blood pressure currently.  Continue to monitor BP, daily CBC, monitor blood cultures  IV drug use (heroin) last use on 6/27.  HIV negative, hep C positive H.  Patient states she  uses to control chronic pain.  Patient Suboxone was discontinued prior to transfer in favor of IV pain control.  Will continue judiciously here  Acute anemia, likely secondary to infectious process above.  Baseline hemoglobin on admission 13.  Last CBC hemoglobin 9 at outside hospital no signs or symptoms of bleeding currently.  Will repeat CBC in a.m.  Thymic hyperplasia incidental finding-follow-up outpatient  Tobacco abuse continue tobacco cessation.  Continue transdermal nicotine   Code Status: FULL CODE, confirmed on day of transfer   Family Communication: no family at bedside   Disposition Plan: need blood cultures to not be positive, rule out osteomyelitis, ortho to evaluate right shoulder pain      Consultants:  ID, Ortho  Procedures: 6/30>> right shoulder arthrocentesis  6/28>> TEE, Confirmed tricuspid valve vegetation 1. Left ventricular systolic function is hyperdynamic with an estimated EF of >70%. 2. There is trace mitral regurgitation. 3. Mild tricuspid regurgitation present. 4. Small, mobile vegetation attached to the tricuspid valve leaflet, and the vegetation measures 0.3 x 0.7 cm. 5. There is no pericardial effusion.  6. No ASD or PFO noted by doppler.  7. No clot noted in the LAA.  6/27>> left internal jugular central venous catheter  Radiological studies (from [facility]) 6/29>> right shoulder MRI: 1. Abnormal edema and enhancement tracking around the supraspinatus, infraspinatus, subscapularis, teres minor, and lateral pectoral musculature indicating inflammation along regional fascia planes which may well be due to infection/fasciitis. No gas in the soft tissues. I do not see a drainable abscess. 2. There is a glenohumeral joint effusion. However, there is only thin synovial enhancement along this effusion and accordingly I would tend to favor a reactive effusion over septic joint.  6-27>> CT chest/abdomen: 1. No pulmonary embolism. 2.  Numerous variably cavitary poorly marginated pulmonary nodules scattered throughout both lungs, most compatible with septic embolism. 3. Prominent main pulmonary artery, which may indicate pulmonary arterial hypertension. 4. Nonspecific homogeneous triangular soft tissue density in the anterior mediastinum, favor thymic hyperplasia. Recommend attention on follow-up chest CT with IV contrast in 3-6 months. 5. Diffuse periportal edema, nonspecific, which could be due to IV fluid resuscitation, with the differential including ascending cholangitis or acute hepatitis. Otherwise no acute abnormality in the abdomen or pelvis. 6. Emphysema   6/27>> chest x-ray: No active disease.  6/27>> CT head: Normal head CT   Antimicrobials: Anti-infectives (From admission, onward)   None        Cultures: 6/30>> synovial fluid from right shoulder culture/Gram stain pending 6/30>> blood culture: Pending 6/29>> blood culture: 2/2 Gram-positive cocci in clusters 6/28>> blood culture: 2/2 MRSA  6/27>> blood culture: 2/2 MRSA   Telemetry:  DVT prophylaxis:  SCds   Objective: Vitals:   03/06/18 1500  BP: 108/78  Pulse: 79  Resp: 20  Temp: 98.8 F (37.1 C)  TempSrc: Oral  SpO2: 100%   No intake or output data in the 24 hours ending 03/06/18 1811 There were no vitals filed for this visit.  Exam:  Constitutional:frail female, looks older than stated age Eyes: EOMI, anicteric, normal conjunctivae ENMT: Oropharynx with moist mucous membranes, IJ in left neck Cardiovascular: RRR no MRGs, with no peripheral edema Respiratory: Normal respiratory effort on room air, clear breath sounds  Abdomen:  Soft,non-tender, normal bowel sounds MSK/Back: Right shoulder exquisitely tender and warm to touch. Strength intact though limited by pain. No spinal or paraspinal tenderness Skin: No rash ulcers, or lesions. Without skin tenting  Neurologic: Grossly no focal neuro deficit. Moving all  extremities on her how Psychiatric:Appropriate affect, and mood. Mental status AAOx3  Data Reviewed: CBC: No results for input(s): WBC, NEUTROABS, HGB, HCT, MCV, PLT in the last 168 hours. Basic Metabolic Panel: No results for input(s): NA, K, CL, CO2, GLUCOSE, BUN, CREATININE, CALCIUM, MG, PHOS in the last 168 hours. GFR: CrCl cannot be calculated (Patient's most recent lab result is older than the maximum 21 days allowed.). Liver Function Tests: No results for input(s): AST, ALT, ALKPHOS, BILITOT, PROT, ALBUMIN in the last 168 hours. No results for input(s): LIPASE, AMYLASE in the last 168 hours. No results for input(s): AMMONIA in the last 168 hours. Coagulation Profile: No results for input(s): INR, PROTIME in the last 168 hours. Cardiac Enzymes: No results for input(s): CKTOTAL, CKMB, CKMBINDEX, TROPONINI in the last 168 hours. BNP (last 3 results) No results for input(s): PROBNP in the last 8760 hours. HbA1C: No results for input(s): HGBA1C in the last 72 hours. CBG: No results for input(s): GLUCAP in the last 168 hours. Lipid Profile: No results for input(s): CHOL, HDL, LDLCALC, TRIG, CHOLHDL, LDLDIRECT in the last 72 hours. Thyroid Function Tests: No results for input(s): TSH, T4TOTAL, FREET4, T3FREE, THYROIDAB in the last 72 hours. Anemia Panel: No results for input(s): VITAMINB12, FOLATE, FERRITIN, TIBC, IRON, RETICCTPCT in the last 72 hours. Urine analysis: No results found for: COLORURINE, APPEARANCEUR, LABSPEC, PHURINE, GLUCOSEU, HGBUR, BILIRUBINUR, KETONESUR, PROTEINUR, UROBILINOGEN, NITRITE, LEUKOCYTESUR Sepsis Labs: _0 (procalcitonin:4,lacticidven:4)  )No results found for this or any previous visit (from the past 240 hour(s)).    Studies: No results found.  Scheduled Meds:  Continuous Infusions:   LOS: 0 days     Desiree Hane, MD [facility] Pager [phone]  If 7PM-7AM, please contact night-coverage [email or web address] Password  [facility] 03/06/2018, 6:11 PM

## 2018-03-06 NOTE — Consult Note (Signed)
Orthopaedic Trauma Service (OTS) Consult   Patient ID: Mackenzie Hughes MRN: [id number] DOB/AGE: March 08, 1981 37 y.o.  Reason for Consult:Right shoulder pain Referring Physician: Dr. Roberto ScalesShayla Nettey, MD [facility].  HPI: Mackenzie HoldingCarla T Hughes is an 37 y.o. female who is being seen in consultation at the request of Dr. Caleb PoppNettey for evaluation of right shoulder pain. The patient has a history of IV drug abuse along with hepatitis C who [facility] June 27.  She is been having pain from her statement for a couple weeks but it worsened over the last few days.  She says it got really bad yesterday.  The patient was reportedly hypotensive.  She was given fluids and pressors in ICU.  She was started on vancomycin and Zosyn for sepsis.  She underwent a TEE which showed a vegetation on her tricuspid valve.  She developed started complaining of significant shoulder and chest pain.  An MRI was ordered and there is concerning findings for fasciitis.  This can combined with her infectious picture the orthopedist felt that it was warranted for transfer to a higher level of care.  There is concern regarding necrotizing fasciitis.  Earlier this morning she had her shoulder aspirated.  Per report the aspiration had 20,000 white blood cells with no organisms.  Currently she states that the pain is unbearable.  It has spread to her left shoulder now.  She can move her shoulder but she cannot raise it above her head.  She states that it is worsening.  Is no difficulty with shortness of breath.  She does no chest pain in the center of her chest.  No past medical history on file.  Past Surgical History:  Procedure Laterality Date  . TONSILLECTOMY    . TUBAL LIGATION      No family history on file.  Social History:  reports that she has been smoking.  She has been smoking about 0.50 packs per day. She does not have any smokeless tobacco history on file. She reports that she does not drink alcohol  or use drugs.  Allergies:  Allergies  Allergen Reactions  . Toradol [Ketorolac Tromethamine]     GI upset  . Tramadol     GI upset    Medications:  No current facility-administered medications on file prior to encounter.    Current Outpatient Medications on File Prior to Encounter  Medication Sig Dispense Refill  . sulfamethoxazole-trimethoprim (BACTRIM DS) 800-160 MG tablet Take 1 tablet by mouth 2 (two) times daily. 10 tablet 0    ROS: Constitutional: +Fever Vision: No changes in vision ENT: No difficulty swallowing CV: +Chest pain Pulm: No SOB or wheezing GI: No nausea or vomiting GU: No urgency or inability to hold urine Skin: No poor wound healing Neurologic: No numbness or tingling Psychiatric: +Depression Heme: No bruising Allergic: No reaction to medications or food   Exam: Blood pressure 108/78, pulse 79, temperature 98.8 F (37.1 C), temperature source Oral, resp. rate 20, weight 46.5 kg (102 lb 8.2 oz), SpO2 100 %. General:In some discomfort and distress Orientation:Awake, alert and orient Mood and Affect: Not cooperative, head underneath covers Gait: Not assessed   Right upper extremity: Reveals no significant erythema.  There is no blanching erythema.  There is no pitting edema around the shoulder or chest.  She does have tenderness to palpation deep.  She has no bony tenderness.  Gentle range of motion with internal and external rotation at the side is without significant discomfort.  Passive range of motion I am able to get her past 90 degrees of forward elevation.  Actively she has a difficult time secondary to pain she is neurovascularly intact to her arm and hand.  Left upper extremity: Reveals skin without lesions.  There is no significant tenderness palpation she has active range of motion to past 90 degrees with forward elevation.  She is neurovascularly intact.   Medical Decision Making: Imaging: MRI was reviewed from [facility].  There is a  effusion.  No signs of osteomyelitis in her glenoid or proximal femurs.  There is some edema surrounding the muscle and the fascia.  No focal abscess or collection.  Labs:  Results for orders placed or performed during the hospital encounter of 03/06/18 (from the past 24 hour(s))  CBC     Status: Abnormal   Collection Time: 03/06/18  6:44 PM  Result Value Ref Range   WBC 7.2 4.0 - 10.5 K/uL   RBC 3.34 (L) 3.87 - 5.11 MIL/uL   Hemoglobin 9.6 (L) 12.0 - 15.0 g/dL   HCT 91.4 (L) 78.2 - 95.6 %   MCV 89.2 78.0 - 100.0 fL   MCH 28.7 26.0 - 34.0 pg   MCHC 32.2 30.0 - 36.0 g/dL   RDW 21.3 08.6 - 57.8 %   Platelets 207 150 - 400 K/uL  Comprehensive metabolic panel     Status: Abnormal   Collection Time: 03/06/18  6:44 PM  Result Value Ref Range   Sodium 140 135 - 145 mmol/L   Potassium 4.9 3.5 - 5.1 mmol/L   Chloride 109 98 - 111 mmol/L   CO2 24 22 - 32 mmol/L   Glucose, Bld 103 (H) 70 - 99 mg/dL   BUN 9 6 - 20 mg/dL   Creatinine, Ser 4.69 0.44 - 1.00 mg/dL   Calcium 7.8 (L) 8.9 - 10.3 mg/dL   Total Protein 5.4 (L) 6.5 - 8.1 g/dL   Albumin 2.0 (L) 3.5 - 5.0 g/dL   AST 35 15 - 41 U/L   ALT 21 0 - 44 U/L   Alkaline Phosphatase 92 38 - 126 U/L   Total Bilirubin 0.7 0.3 - 1.2 mg/dL   GFR calc non Af Amer >60 >60 mL/min   GFR calc Af Amer >60 >60 mL/min   Anion gap 7 5 - 15    Medical history and chart was reviewed  Assessment/Plan: 37 year old female with IV drug abuse and bacterial endocarditis with right shoulder and chest pain  Currently her exam is not concerning for necrotizing fasciitis.  Is not also not concerning for septic arthritis.  She likely has some element of infectious fasciitis.  There is no focal collection on MRI.  There is no current role for surgical management.  We can follow the cultures from [facility].  I would recommend symptom control with IV antibiotics pain medication and anti-inflammatory medication.  I will continue to follow along to monitor for  improvement.  I recommend infectious disease consult for their opinion   Roby Lofts, MD [facility] [phone] (phone)

## 2018-03-06 NOTE — Progress Notes (Signed)
ANTIBIOTIC CONSULT NOTE - INITIAL  Pharmacy Consult for Vancomycin  Indication: Bacterial endocarditis  Allergies  Allergen Reactions  . Toradol [Ketorolac Tromethamine]     GI upset  . Tramadol     GI upset    Patient Measurements: Weight: 102 lb 8.2 oz (46.5 kg)   Vital Signs: Temp: 98.8 F (37.1 C) (06/30 1500) Temp Source: Oral (06/30 1500) BP: 108/78 (06/30 1500) Pulse Rate: 79 (06/30 1500) Intake/Output from previous day: No intake/output data recorded. Intake/Output from this shift: No intake/output data recorded.  Labs: No results for input(s): WBC, HGB, PLT, LABCREA, CREATININE in the last 72 hours. CrCl cannot be calculated (Patient's most recent lab result is older than the maximum 21 days allowed.). No results for input(s): VANCOTROUGH, VANCOPEAK, VANCORANDOM, GENTTROUGH, GENTPEAK, GENTRANDOM, TOBRATROUGH, TOBRAPEAK, TOBRARND, AMIKACINPEAK, AMIKACINTROU, AMIKACIN in the last 72 hours.   Microbiology: No results found for this or any previous visit (from the past 720 hour(s)).  Medical History: 37 yo female transfer from OSH with bacterial endocarditis with MRSA positive blood cultures, septic emboli and TEE positive for a vegetation on the tricuspid valve. Patient has h/o IVDA.    Assessment: Pharmacy asked to dose vancomycin. Patient started on vancomycin at OSH. Vancomycin trough 6 on 6/29 and dose subsequently increased to 1000mg  IV q8h. Last dose given at 1123 on 6/30. WBC 9, and last Scr 0.4 mg/dl. Patient weighs 46.5kg  Goal of Therapy:  Vancomycin troughs of 15-20 mcg/ml  Plan:  Resume Vancomycin 1000mg  IV q8h at 1930 this evening. Will obtain vancomycin trough once patient reaches steady state.  Monitor repeat cultures, LOT, clinical course, and VT as indicated  Shaylynne Lunt A. Jeanella CrazePierce, PharmD, BCPS Clinical Pharmacist [place] Pager: [phone] Please utilize Amion for appropriate phone number to reach the unit pharmacist [facility])   03/06/2018,6:14 PM

## 2018-03-06 NOTE — Plan of Care (Signed)
  Problem: Education: Goal: Knowledge of General Education information will improve Outcome: Progressing   Problem: Activity: Goal: Risk for activity intolerance will decrease Outcome: Progressing   Problem: Coping: Goal: Level of anxiety will decrease Outcome: Progressing   Problem: Elimination: Goal: Will not experience complications related to bowel motility Outcome: Progressing   Problem: Pain Managment: Goal: General experience of comfort will improve Outcome: Progressing   Problem: Safety: Goal: Ability to remain free from injury will improve Outcome: Progressing

## 2018-03-07 ENCOUNTER — Inpatient Hospital Stay (HOSPITAL_COMMUNITY): Payer: Self-pay

## 2018-03-07 DIAGNOSIS — M2550 Pain in unspecified joint: Secondary | ICD-10-CM

## 2018-03-07 DIAGNOSIS — F17210 Nicotine dependence, cigarettes, uncomplicated: Secondary | ICD-10-CM

## 2018-03-07 DIAGNOSIS — B9562 Methicillin resistant Staphylococcus aureus infection as the cause of diseases classified elsewhere: Secondary | ICD-10-CM

## 2018-03-07 DIAGNOSIS — Z720 Tobacco use: Secondary | ICD-10-CM | POA: Diagnosis present

## 2018-03-07 DIAGNOSIS — R7881 Bacteremia: Secondary | ICD-10-CM

## 2018-03-07 DIAGNOSIS — L8990 Pressure ulcer of unspecified site, unspecified stage: Secondary | ICD-10-CM | POA: Diagnosis present

## 2018-03-07 DIAGNOSIS — M549 Dorsalgia, unspecified: Secondary | ICD-10-CM

## 2018-03-07 DIAGNOSIS — M25511 Pain in right shoulder: Secondary | ICD-10-CM

## 2018-03-07 DIAGNOSIS — I361 Nonrheumatic tricuspid (valve) insufficiency: Secondary | ICD-10-CM

## 2018-03-07 LAB — COMPREHENSIVE METABOLIC PANEL
ALT: 19 U/L (ref 0–44)
AST: 24 U/L (ref 15–41)
Albumin: 1.7 g/dL — ABNORMAL LOW (ref 3.5–5.0)
Alkaline Phosphatase: 75 U/L (ref 38–126)
Anion gap: 5 (ref 5–15)
BUN: 8 mg/dL (ref 6–20)
CHLORIDE: 107 mmol/L (ref 98–111)
CO2: 26 mmol/L (ref 22–32)
CREATININE: 0.45 mg/dL (ref 0.44–1.00)
Calcium: 7.5 mg/dL — ABNORMAL LOW (ref 8.9–10.3)
GFR calc Af Amer: >60 mL/min (ref >60)
GFR calc non Af Amer: >60 mL/min (ref >60)
Glucose, Bld: 112 mg/dL — ABNORMAL HIGH (ref 70–99)
POTASSIUM: 3.8 mmol/L (ref 3.5–5.1)
SODIUM: 138 mmol/L (ref 135–145)
Total Bilirubin: 0.6 mg/dL (ref 0.3–1.2)
Total Protein: 4.6 g/dL — ABNORMAL LOW (ref 6.5–8.1)

## 2018-03-07 LAB — CBC
HCT: 25.1 % — ABNORMAL LOW (ref 36.0–46.0)
Hemoglobin: 8.2 g/dL — ABNORMAL LOW (ref 12.0–15.0)
MCH: 28.9 pg (ref 26.0–34.0)
MCHC: 32.7 g/dL (ref 30.0–36.0)
MCV: 88.4 fL (ref 78.0–100.0)
PLATELETS: 189 10*3/uL (ref 150–400)
RBC: 2.84 MIL/uL — ABNORMAL LOW (ref 3.87–5.11)
RDW: 14.2 % (ref 11.5–15.5)
WBC: 9.2 10*3/uL (ref 4.0–10.5)

## 2018-03-07 MED ORDER — LORAZEPAM 2 MG/ML IJ SOLN: 1.0000 mg | Freq: Once | INTRAMUSCULAR | Status: DC

## 2018-03-07 MED ORDER — TRAMADOL HCL 50 MG PO TABS
50.0000 mg | ORAL_TABLET | Freq: Once | ORAL | Status: AC
  Administered 2018-03-07: 50 mg via ORAL
  Filled 2018-03-07: qty 1

## 2018-03-07 MED ORDER — HYDROMORPHONE HCL 1 MG/ML IJ SOLN
1.0000 mg | INTRAMUSCULAR | Status: DC | PRN
  Filled 2018-03-07: qty 1

## 2018-03-07 NOTE — Progress Notes (Signed)
IV Team: Spoke with Tobi BastosAnna RN about central line not in placed. RN will let MD know. Will monitor.    Annett Fabianosalyn Kasheem Toner RN IV [facility] Team

## 2018-03-07 NOTE — Progress Notes (Signed)
Patient called RN to room to inform she was leaving to go home. RN explained to patient that her doctor had not cleared her to go home yet and she was not finished with IV antibiotics and if she went home she would be leaving against medical advice. Patient verbalized understanding and stated she was still going home. Dr. Alvino Chapelhoi notified, stated she could leave AMA as long as her IJ was removed. IV team notified and removed IJ. Peripheral IV removed by RN. AMA papers signed by patient and RN and placed in chart.

## 2018-03-07 NOTE — Progress Notes (Signed)
Arrived to discontinue central line but patient is currently off the floor in MRI.  Requested IV consult be entered when patient returns.  Gasper LloydKerry Soo Steelman, RN [facility]

## 2018-03-07 NOTE — Plan of Care (Signed)
  Problem: Activity: Goal: Risk for activity intolerance will decrease Outcome: Not Progressing   Problem: Nutrition: Goal: Adequate nutrition will be maintained Outcome: Progressing   Problem: Coping: Goal: Level of anxiety will decrease Outcome: Progressing   Problem: Pain Managment: Goal: General experience of comfort will improve Outcome: Not Progressing  Pt requires frequent pain medicine.

## 2018-03-07 NOTE — Consult Note (Signed)
[facility]    Date of Admission:  03/06/2018   Total days of  antibiotics- vancomycin     Day 5               Reason for Consult: MRSA Bacteremia     Referring Provider: Choi,J.    Assessment: Pt is a 37 year old female IV drug user presenting with MRSA bacteremia complicated with tricuspid valve vegetation, infective endocarditis and septic pulmonary emboli. Blood cultures remain positive for MRSA on day 4. Pt is on day 2 of vancomycin and tolerating medication well. Pt also presented with right shoulder pain, synovial fluid culture and stain (no growth), imaging studies and orthopedic consult are all suggestive of infectious process. Will continue to monitor to right shoulder pain.  Pt also has acute on chronic back pain, awaiting MRI results to rule out vertebral infection.   Plan: 1. Continue vancomycin 2. Repeat blood cultures 3. Review MRI of cervical, thoracic and lumbar spine 4. Monitor shoulder pain    Principal Problem:   MRSA bacteremia Active Problems:   Acute infective endocarditis   Anemia   Severe sepsis with septic shock (HCC)   Chronic hepatitis C without hepatic coma (HCC)   IV drug user   Infective endocarditis   Pressure injury of skin   Scheduled Meds: Continuous Infusions: . vancomycin 1,000 mg (03/07/18 0224)   PRN Meds:.acetaminophen **OR** acetaminophen, HYDROmorphone (DILAUDID) injection, ketorolac, ondansetron **OR** ondansetron (ZOFRAN) IV, polyethylene glycol, sodium chloride flush  HPI: Mackenzie Hughes is a 37 y.o. female with a PMHx significant for IV drug use (heroin), hepatitis C with no treatment, and ovarian cancer diagnosed in 2009 who [facility] with severe right arm and chest pain. She also reported 5-7 days of fever and generalized arthralgias and myalgias. Pt reported she was using IV heroin everyday leading up to the hospital admission.   [facility]- Per chart review patient was hypotensive upon arrival with IV fluids given, central line placed and started on levophed while managed in the ICU. Pt was empirically started on vancomycin and zosyn for sepsis secondary to infectious endocarditis given history of IV drug use and septic pulm emboli seen on imaging. Pt had a TEE on 03/04/18 which showed a small vegetation on tricuspid valve on.   An MRI on 6//29/19 of right shoulder showed effusion and inflammation of numerous muscles reported concern for infection/fascitis with no gas in soft tissues and no drainable abscess. [facility]opedic specialist at [facility] imaging and recommended transfer given concern for possible developing necrotizing fascitis. [facility]. [facility] was hemodynamically stable and complaining mainly of right shoulder pain. She was seen and evaluated by [facility] and no concern for necrotizing fascitis or septic arthritis. Likely an element of infectious fascitis, no collection on MRI.  Today patient was seen and evaluated bedside complaining mostly of chest and bilateral shoulder pain. Pt reported pain level to be a 10/10 in the right shoulder.   Review of Systems: Review of Systems  Constitutional: Negative for fever.  Cardiovascular: Positive for chest pain.  Musculoskeletal: Positive for back pain and joint pain (bilateral shoulder pain).  Skin: Negative.     No past medical history on file.  Social History   Tobacco Use  . Smoking status: Heavy Tobacco Smoker    Packs/day: 0.50  Substance Use  Topics  . Alcohol use: No  . Drug use: No    No family history on file. Allergies  Allergen Reactions  . Toradol [Ketorolac Tromethamine] Other (See Comments)    GI upset  . Tramadol Other (See Comments)    GI upset    OBJECTIVE: Blood pressure 111/85, pulse 90, temperature 99.8 F (37.7 C), temperature source Oral, resp. rate 20, weight 46.5 kg (102  lb 8.2 oz), SpO2 99 %.  Physical Exam  Constitutional:  anxious  Cardiovascular: Normal rate, regular rhythm and normal heart sounds.  Pulmonary/Chest: Effort normal and breath sounds normal.  Musculoskeletal:       Right shoulder: She exhibits decreased range of motion.  Skin: Skin is warm.  Psychiatric: She has a normal mood and affect. Her behavior is normal.    Lab Results Lab Results  Component Value Date   WBC 9.2 03/07/2018   HGB 8.2 (L) 03/07/2018   HCT 25.1 (L) 03/07/2018   MCV 88.4 03/07/2018   PLT 189 03/07/2018    Lab Results  Component Value Date   CREATININE 0.45 03/07/2018   BUN 8 03/07/2018   NA 138 03/07/2018   K 3.8 03/07/2018   CL 107 03/07/2018   CO2 26 03/07/2018    Lab Results  Component Value Date   ALT 19 03/07/2018   AST 24 03/07/2018   ALKPHOS 75 03/07/2018   BILITOT 0.6 03/07/2018     Microbiology: No results found for this or any previous visit (from the past 240 hour(s)).   Labs obtained from [facility]: Blood culture + for MRSA   Sensitivity: Vancomycin, TMP-SMX, Linezolid  Resistance: Doxycycline Synovial fluid culture of right shoulder/gram stain- no growth Urine Culture- no growth day     Mackenzie Elaina Pattee, DO [facility]  03/07/2018, 8:31 AM

## 2018-03-07 NOTE — Progress Notes (Signed)
PROGRESS NOTE    Mackenzie Hughes  QIO:[id number] DOB: [birth year] DOA: 03/06/2018 PCP: No primary care provider on file.     Brief Narrative:  Mackenzie Hughes is a 37 y.o. year old female with medical history significant for IV drug use, heroin), hepatitis C (without treatment), and ovarian cancer (2009) who presented to [facility] on 03/03/18 with 5 to 7 days of reported fever generalized arthralgias and myalgias.  She can only remember her last use of IV heroin about a week prior to admission since then she recalled several days of worsening right arm and chest pain which led to her presenting to [place].  Per chart review she was brought to the ED with complaints of generalized malaise, fatigue, cough, abdominal pain and altered mental status. At [facility], she was hypotensive and central line was placed with levophed administration in ICU. She underwent TEE 6/28 which confirmed small vegetation on tricuspid valve. MRI right shoulder showed effusion and inflammation and was evaluated by orthopedic surgery who recommended transfer to [facility] for concern for necrotizing fasciitis.   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  Positive blood cultures 6/28, 6/29. Blood cultures form 6/30 remains positive. Likely source of infection vegetation on tricuspid valve as confirmed on TEE on 6/28. ID consulted. Continue IV vanco, Blood culture 7/1 obtained at [facility], pending.   Right shoulder pain, likely infectious fasciitis.  Arthrocentesis performed on 6/30 by orthopedics: WBC greater than 20,000, no organisms on gram stain culture sensitivity, crystals, Gram stain and cell count pending at outside hospital.  MRI imaging at [facility] suggests infectious/fasciitis process.  Back pain, worsening.  Unable to perform MRI at outside hospital.  MRI cervical spine without evidence of acute cervical spine infection. Unable to tolerate MRI thoracic or lumbar spine today   Septic shock secondary to above, resolved.  Patient previously required levophed at outside hospital from 6/28 with weaning of infusion on the morning of 6/30 prior to transfer.  Normal blood pressure currently.   IV drug use (heroin) last use on 6/27.  HIV negative, hep C positive H.  Patient states she uses to control chronic pain.  Patient Suboxone was discontinued prior to transfer in favor of IV pain control.  Will continue judiciously here  Acute anemia, likely secondary to infectious process above.  Baseline hemoglobin on admission 13.  Last CBC hemoglobin 9 at outside hospital no signs or symptoms of bleeding currently. Trend   Thymic hyperplasia: Incidental finding. Follow-up outpatient  Tobacco abuse: Continue tobacco cessation.  Continue transdermal nicotine   DVT prophylaxis: SCDs Code Status: Full Family Communication: No family at bedside Disposition Plan: Pending further MRI, ID recommendation, ortho recommendation   Consultants:   ID  Ortho  Procedures:  6/30>> right shoulder arthrocentesis  6/28>> TEE, Confirmed tricuspid valve vegetation 1. Left ventricular systolic function is hyperdynamic with an estimated EF of >70%. 2. There is trace mitral regurgitation. 3. Mild tricuspid regurgitation present. 4. Small, mobile vegetation attached to  the tricuspid valve leaflet, and the vegetation measures 0.3 x 0.7 cm. 5. There is no pericardial effusion.  6. No ASD or PFO noted by doppler.  7. No clot noted in the LAA.  6/27>>  left internal jugular central venous catheter  [facility] 6/29>> right shoulder MRI: 1. Abnormal edema and enhancement tracking around the supraspinatus, infraspinatus, subscapularis, teres minor, and lateral pectoral musculature indicating inflammation along regional fascia planes which may well be due to infection/fasciitis. No gas in the soft tissues. I do not see a drainable abscess. 2. There is a glenohumeral joint effusion. However, there is only thin synovial enhancement along this effusion and accordingly I would tend to favor a reactive effusion over septic joint.  6/27>> CT chest/abdomen: 1. No pulmonary embolism. 2. Numerous variably cavitary poorly marginated pulmonary nodules scattered throughout both lungs, most compatible with septic embolism. 3. Prominent main pulmonary artery, which may indicate pulmonary arterial hypertension. 4. Nonspecific homogeneous triangular soft tissue density in the anterior mediastinum, favor thymic hyperplasia. Recommend attention on follow-up chest CT with IV contrast in 3-6 months. 5. Diffuse periportal edema, nonspecific, which could be due to IV fluid resuscitation, with the differential including ascending cholangitis or acute hepatitis. Otherwise no acute abnormality in the abdomen or pelvis. 6. Emphysema   6/27>> Chest x-ray: No active disease.  6/27>> CT head: Normal head CT   Antimicrobials:  Anti-infectives (From admission, onward)   Start     Dose/Rate Route Frequency Ordered Stop   03/06/18 1930  vancomycin (VANCOCIN) IVPB 1000 mg/200 mL premix     1,000 mg 200 mL/hr over 60 Minutes Intravenous Every 8 hours 03/06/18 1823         Objective: Vitals:   03/06/18 1500 03/06/18 1800 03/06/18 2025 03/07/18 0500  BP: 108/78  124/88 111/85  Pulse: 79  (!) 102 90  Resp: 20  (!) 21 20  Temp: 98.8 F (37.1 C)  99.3 F (37.4 C) 99.8 F (37.7 C)  TempSrc: Oral  Oral Oral  SpO2:  100%  98% 99%  Weight:  46.5 kg (102 lb 8.2 oz)      Intake/Output Summary (Last 24 hours) at 03/07/2018 1409 Last data filed at 03/07/2018 [id number] Gross per 24 hour  Intake 10 ml  Output -  Net 10 ml   Filed Weights   03/06/18 1800  Weight: 46.5 kg (102 lb 8.2 oz)    Examination:  General exam: Appears calm and comfortable until I walked into the room, then had excruciating pain everywhere and seemed anxious  Respiratory system: Clear to auscultation. Respiratory effort normal. Cardiovascular system: S1 & S2 heard, RRR. No JVD, murmurs, rubs, gallops or clicks. No pedal edema. Gastrointestinal system: Abdomen is nondistended, soft and nontender. No organomegaly or masses felt. Normal bowel sounds heard. Central nervous system: Alert and oriented. No focal neurological deficits. Extremities: Symmetric Skin: No rashes, lesions or ulcers, without erythema of right shoulder  Psychiatry: Judgement and insight appear poor  Data Reviewed: I have personally reviewed following labs and imaging studies  CBC: Recent Labs  Lab 03/06/18 1844 03/07/18 0325  WBC 7.2 9.2  HGB 9.6* 8.2*  HCT 29.8* 25.1*  MCV 89.2 88.4  PLT 207 189   Basic Metabolic Panel: Recent Labs  Lab 03/06/18 1844 03/07/18 0325  NA 140 138  K 4.9 3.8  CL 109 107  CO2 24 26  GLUCOSE 103* 112*  BUN 9 8  CREATININE 0.47 0.45  CALCIUM 7.8* 7.5*  GFR: CrCl cannot be calculated (Unknown ideal weight.). Liver Function Tests: Recent Labs  Lab 03/06/18 1844 03/07/18 0325  AST 35 24  ALT 21 19  ALKPHOS 92 75  BILITOT 0.7 0.6  PROT 5.4* 4.6*  ALBUMIN 2.0* 1.7*   No results for input(s): LIPASE, AMYLASE in the last 168 hours. No results for input(s): AMMONIA in the last 168 hours. Coagulation Profile: No results for input(s): INR, PROTIME in the last 168 hours. Cardiac Enzymes: No results for input(s): CKTOTAL, CKMB, CKMBINDEX, TROPONINI in the last 168 hours. BNP (last 3 results) No results for input(s):  PROBNP in the last 8760 hours. HbA1C: No results for input(s): HGBA1C in the last 72 hours. CBG: No results for input(s): GLUCAP in the last 168 hours. Lipid Profile: No results for input(s): CHOL, HDL, LDLCALC, TRIG, CHOLHDL, LDLDIRECT in the last 72 hours. Thyroid Function Tests: No results for input(s): TSH, T4TOTAL, FREET4, T3FREE, THYROIDAB in the last 72 hours. Anemia Panel: No results for input(s): VITAMINB12, FOLATE, FERRITIN, TIBC, IRON, RETICCTPCT in the last 72 hours. Sepsis Labs: No results for input(s): PROCALCITON, LATICACIDVEN in the last 168 hours.  No results found for this or any previous visit (from the past 240 hour(s)).     Radiology Studies: Dg Chest 1 View  Result Date: 03/07/2018 CLINICAL DATA:  Central line placement EXAM: CHEST  1 VIEW COMPARISON:  03/04/2018 FINDINGS: Left IJ line with tip near the brachiocephalic SVC confluence. Patchy lung opacity asymmetric to the left, progressed. No Kerley lines, effusion, or pneumothorax. Normal heart size IMPRESSION: Worsening pneumonia on the left. Electronically Signed   By: Marnee Spring M.D.   On: 03/07/2018 07:07   Mr Cervical Spine Wo Contrast  Result Date: 03/07/2018 CLINICAL DATA:  Worsening spine pain.  Bacteremia with endocarditis. EXAM: MRI CERVICAL SPINE WITHOUT CONTRAST TECHNIQUE: Multiplanar, multisequence MR imaging of the cervical spine was performed. No intravenous contrast was administered. COMPARISON:  Cervical spine radiographs 02/16/2006 FINDINGS: Cervical, thoracic, and lumbar spine MRIs without and with contrast were requested, however the patient terminated the examination after completion of the noncontrast cervical spine MRI. Some sequences are mildly motion degraded. Alignment: Mild reversal of the normal cervical lordosis. No significant listhesis. Vertebrae: Diffusely decreased bone marrow T1 signal intensity, nonspecific though may be secondary to known anemia. No fracture or suspicious focal  osseous lesion. No evidence of acute discitis/osteomyelitis. Cord: Normal signal and morphology. Posterior Fossa, vertebral arteries, paraspinal tissues: Preserved vertebral artery flow voids. No evidence of significant inflammation or abscess in the paraspinal soft tissues Disc levels: C2-3 through C5-6: Negative. C6-7: Broad-based posterior disc osteophyte complex without stenosis. C7-T1: Negative. IMPRESSION: 1. No evidence of acute cervical spine infection. 2. C6-7 disc degeneration without stenosis. Electronically Signed   By: Sebastian Ache M.D.   On: 03/07/2018 12:40      Scheduled Meds: . LORazepam  1 mg Intravenous Once   Continuous Infusions: . vancomycin 1,000 mg (03/07/18 1253)     LOS: 1 day    Time spent: 35 minutes   Noralee Stain, DO [facility] [email or web address] Password [email or web address] 03/07/2018, 2:09 PM

## 2018-03-08 NOTE — Discharge Summary (Signed)
Physician Discharge Summary  Mackenzie Hughes:[id number] DOB: 1981/02/14 DOA: 03/06/2018  PCP: No primary care provider on file.  Admit date: 03/06/2018 Discharge date: 03/08/2018  PATIENT LEFT AMA  Brief/Interim Summary: Mackenzie Dula Lassiteris a 37 y.o.year old femalewith medical history significant for IV drug use, heroin), hepatitis C (without treatment), and ovarian cancer (2009) who presented to [facility] on 03/03/18 with 5 to 7 days of reported fever generalized arthralgias and myalgias. She can only remember her last use of IV heroin about a week prior to admission since then she recalled several days of worsening right arm and chest pain which led to her presenting to [facility]. Per chart review she was brought to the ED with complaints of generalized malaise, fatigue, cough, abdominal pain and altered mental status. At [facility], she was hypotensive and central line was placed with levophed administration in ICU. She underwent TEE 6/28 which confirmed small vegetation on tricuspid valve. MRI right shoulder showed effusion and inflammation and was evaluated by orthopedic surgery who recommended transfer to [facility] for concern for necrotizing fasciitis. She was treated with IV vancomycin, awaiting repeat blood cultures. ID consulted. On 7/1, she elected to sign out against medical advice.   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       Allergies  Allergen Reactions  . Toradol [Ketorolac Tromethamine] Other (See Comments)    GI upset  . Tramadol Other (See Comments)    GI upset    Procedures/Studies: Dg Chest 1 View  Result Date: 03/07/2018 CLINICAL DATA:  Central line placement EXAM: CHEST  1 VIEW COMPARISON:  03/04/2018 FINDINGS: Left IJ line with tip near the brachiocephalic SVC confluence. Patchy lung opacity asymmetric to the left, progressed. No Kerley lines, effusion, or pneumothorax. Normal heart size IMPRESSION: Worsening pneumonia on the left. Electronically Signed   By: Marnee Spring M.D.   On: 03/07/2018 07:07   Mr Cervical Spine Wo Contrast  Result Date: 03/07/2018 CLINICAL DATA:  Worsening spine pain.  Bacteremia with endocarditis. EXAM: MRI CERVICAL SPINE WITHOUT CONTRAST TECHNIQUE: Multiplanar, multisequence MR imaging of the cervical spine was performed. No intravenous contrast was administered. COMPARISON:  Cervical spine radiographs 02/16/2006 FINDINGS: Cervical, thoracic, and lumbar spine MRIs without and with contrast were requested, however the patient terminated the examination after completion of the noncontrast cervical spine MRI. Some sequences are mildly motion degraded. Alignment: Mild reversal of the normal cervical lordosis. No significant listhesis. Vertebrae: Diffusely decreased bone marrow T1 signal intensity, nonspecific though may be secondary to known anemia. No fracture or suspicious focal osseous lesion. No evidence of acute discitis/osteomyelitis. Cord: Normal signal and morphology. Posterior Fossa, vertebral arteries, paraspinal tissues: Preserved vertebral artery flow voids. No evidence of significant inflammation or abscess in the paraspinal soft tissues Disc levels: C2-3 through C5-6: Negative. C6-7: Broad-based posterior disc osteophyte  complex without  stenosis. C7-T1: Negative. IMPRESSION: 1. No evidence of acute cervical spine infection. 2. C6-7 disc degeneration without stenosis. Electronically Signed   By: Sebastian AcheAllen  Grady M.D.   On: 03/07/2018 12:40       The results of significant diagnostics from this hospitalization (including imaging, microbiology, ancillary and laboratory) are listed below for reference.     Microbiology: No results found for this or any previous visit (from the past 240 hour(s)).   Labs: BNP (last 3 results) No results for input(s): BNP in the last 8760 hours. Basic Metabolic Panel: Recent Labs  Lab 03/06/18 1844 03/07/18 0325  NA 140 138  K 4.9 3.8  CL 109 107  CO2 24 26  GLUCOSE 103* 112*  BUN 9 8  CREATININE 0.47 0.45  CALCIUM 7.8* 7.5*   Liver Function Tests: Recent Labs  Lab 03/06/18 1844 03/07/18 0325  AST 35 24  ALT 21 19  ALKPHOS 92 75  BILITOT 0.7 0.6  PROT 5.4* 4.6*  ALBUMIN 2.0* 1.7*   No results for input(s): LIPASE, AMYLASE in the last 168 hours. No results for input(s): AMMONIA in the last 168 hours. CBC: Recent Labs  Lab 03/06/18 1844 03/07/18 0325  WBC 7.2 9.2  HGB 9.6* 8.2*  HCT 29.8* 25.1*  MCV 89.2 88.4  PLT 207 189   Cardiac Enzymes: No results for input(s): CKTOTAL, CKMB, CKMBINDEX, TROPONINI in the last 168 hours. BNP: Invalid input(s): POCBNP CBG: No results for input(s): GLUCAP in the last 168 hours. D-Dimer No results for input(s): DDIMER in the last 72 hours. Hgb A1c No results for input(s): HGBA1C in the last 72 hours. Lipid Profile No results for input(s): CHOL, HDL, LDLCALC, TRIG, CHOLHDL, LDLDIRECT in the last 72 hours. Thyroid function studies No results for input(s): TSH, T4TOTAL, T3FREE, THYROIDAB in the last 72 hours.  Invalid input(s): FREET3 Anemia work up No results for input(s): VITAMINB12, FOLATE, FERRITIN, TIBC, IRON, RETICCTPCT in the last 72 hours. Urinalysis No results found for: COLORURINE, APPEARANCEUR, LABSPEC, PHURINE,  GLUCOSEU, HGBUR, BILIRUBINUR, KETONESUR, PROTEINUR, UROBILINOGEN, NITRITE, LEUKOCYTESUR Sepsis Labs Invalid input(s): PROCALCITONIN,  WBC,  LACTICIDVEN Microbiology No results found for this or any previous visit (from the past 240 hour(s)).   SIGNED:  Noralee StainJennifer Aden Youngman, DO [facility] Pager [phone]  If 7PM-7AM, please contact night-coverage [email or web address] Password [email or web address] 03/08/2018, 8:09 AM

## 2018-03-12 LAB — CULTURE, BLOOD (ROUTINE X 2)
CULTURE: NO GROWTH
Culture: NO GROWTH

## 2018-04-11 DIAGNOSIS — Z720 Tobacco use: Secondary | ICD-10-CM

## 2018-04-11 DIAGNOSIS — D649 Anemia, unspecified: Secondary | ICD-10-CM

## 2018-04-11 DIAGNOSIS — F1910 Other psychoactive substance abuse, uncomplicated: Secondary | ICD-10-CM

## 2018-08-16 DIAGNOSIS — I38 Endocarditis, valve unspecified: Secondary | ICD-10-CM

## 2018-08-16 DIAGNOSIS — I340 Nonrheumatic mitral (valve) insufficiency: Secondary | ICD-10-CM

## 2018-09-12 ENCOUNTER — Encounter (HOSPITAL_COMMUNITY): Payer: Self-pay | Admitting: Emergency Medicine

## 2018-09-12 ENCOUNTER — Inpatient Hospital Stay (HOSPITAL_COMMUNITY): Payer: Medicaid Other

## 2018-09-12 ENCOUNTER — Other Ambulatory Visit: Payer: Self-pay

## 2018-09-12 ENCOUNTER — Inpatient Hospital Stay (HOSPITAL_COMMUNITY)
Admission: EM | Admit: 2018-09-12 | Discharge: 2018-09-21 | DRG: 097 | Discharge status: Against Medical Advice | Payer: Medicaid Other | Attending: Internal Medicine | Admitting: Internal Medicine

## 2018-09-12 DIAGNOSIS — I058 Other rheumatic mitral valve diseases: Secondary | ICD-10-CM | POA: Diagnosis present

## 2018-09-12 DIAGNOSIS — R64 Cachexia: Secondary | ICD-10-CM | POA: Diagnosis present

## 2018-09-12 DIAGNOSIS — R402143 Coma scale, eyes open, spontaneous, at hospital admission: Secondary | ICD-10-CM | POA: Diagnosis present

## 2018-09-12 DIAGNOSIS — Z8659 Personal history of other mental and behavioral disorders: Secondary | ICD-10-CM | POA: Diagnosis not present

## 2018-09-12 DIAGNOSIS — M609 Myositis, unspecified: Secondary | ICD-10-CM | POA: Diagnosis present

## 2018-09-12 DIAGNOSIS — Z87898 Personal history of other specified conditions: Secondary | ICD-10-CM | POA: Diagnosis not present

## 2018-09-12 DIAGNOSIS — Z8619 Personal history of other infectious and parasitic diseases: Secondary | ICD-10-CM | POA: Diagnosis not present

## 2018-09-12 DIAGNOSIS — I059 Rheumatic mitral valve disease, unspecified: Secondary | ICD-10-CM | POA: Diagnosis present

## 2018-09-12 DIAGNOSIS — Z8661 Personal history of infections of the central nervous system: Secondary | ICD-10-CM | POA: Diagnosis not present

## 2018-09-12 DIAGNOSIS — N280 Ischemia and infarction of kidney: Secondary | ICD-10-CM | POA: Diagnosis present

## 2018-09-12 DIAGNOSIS — B9562 Methicillin resistant Staphylococcus aureus infection as the cause of diseases classified elsewhere: Secondary | ICD-10-CM | POA: Diagnosis present

## 2018-09-12 DIAGNOSIS — Z590 Homelessness: Secondary | ICD-10-CM

## 2018-09-12 DIAGNOSIS — Z8614 Personal history of Methicillin resistant Staphylococcus aureus infection: Secondary | ICD-10-CM

## 2018-09-12 DIAGNOSIS — Z9119 Patient's noncompliance with other medical treatment and regimen: Secondary | ICD-10-CM

## 2018-09-12 DIAGNOSIS — I330 Acute and subacute infective endocarditis: Secondary | ICD-10-CM | POA: Diagnosis present

## 2018-09-12 DIAGNOSIS — I081 Rheumatic disorders of both mitral and tricuspid valves: Secondary | ICD-10-CM | POA: Diagnosis present

## 2018-09-12 DIAGNOSIS — J189 Pneumonia, unspecified organism: Secondary | ICD-10-CM

## 2018-09-12 DIAGNOSIS — G0490 Encephalitis and encephalomyelitis, unspecified: Principal | ICD-10-CM | POA: Diagnosis present

## 2018-09-12 DIAGNOSIS — Z681 Body mass index (BMI) 19 or less, adult: Secondary | ICD-10-CM

## 2018-09-12 DIAGNOSIS — F17210 Nicotine dependence, cigarettes, uncomplicated: Secondary | ICD-10-CM | POA: Diagnosis present

## 2018-09-12 DIAGNOSIS — Z5320 Procedure and treatment not carried out because of patient's decision for unspecified reasons: Secondary | ICD-10-CM | POA: Diagnosis not present

## 2018-09-12 DIAGNOSIS — Z8669 Personal history of other diseases of the nervous system and sense organs: Secondary | ICD-10-CM

## 2018-09-12 DIAGNOSIS — Z720 Tobacco use: Secondary | ICD-10-CM | POA: Diagnosis present

## 2018-09-12 DIAGNOSIS — Z886 Allergy status to analgesic agent status: Secondary | ICD-10-CM

## 2018-09-12 DIAGNOSIS — Z885 Allergy status to narcotic agent status: Secondary | ICD-10-CM

## 2018-09-12 DIAGNOSIS — Z86711 Personal history of pulmonary embolism: Secondary | ICD-10-CM

## 2018-09-12 DIAGNOSIS — F1120 Opioid dependence, uncomplicated: Secondary | ICD-10-CM | POA: Diagnosis present

## 2018-09-12 DIAGNOSIS — R825 Elevated urine levels of drugs, medicaments and biological substances: Secondary | ICD-10-CM

## 2018-09-12 DIAGNOSIS — E43 Unspecified severe protein-calorie malnutrition: Secondary | ICD-10-CM | POA: Diagnosis present

## 2018-09-12 DIAGNOSIS — R451 Restlessness and agitation: Secondary | ICD-10-CM

## 2018-09-12 DIAGNOSIS — R402363 Coma scale, best motor response, obeys commands, at hospital admission: Secondary | ICD-10-CM | POA: Diagnosis present

## 2018-09-12 DIAGNOSIS — Z8543 Personal history of malignant neoplasm of ovary: Secondary | ICD-10-CM

## 2018-09-12 DIAGNOSIS — I38 Endocarditis, valve unspecified: Secondary | ICD-10-CM | POA: Insufficient documentation

## 2018-09-12 DIAGNOSIS — R402253 Coma scale, best verbal response, oriented, at hospital admission: Secondary | ICD-10-CM | POA: Diagnosis present

## 2018-09-12 DIAGNOSIS — M60069 Infective myositis, unspecified lower leg: Secondary | ICD-10-CM

## 2018-09-12 DIAGNOSIS — D735 Infarction of spleen: Secondary | ICD-10-CM

## 2018-09-12 DIAGNOSIS — E512 Wernicke's encephalopathy: Secondary | ICD-10-CM | POA: Diagnosis present

## 2018-09-12 DIAGNOSIS — R7881 Bacteremia: Secondary | ICD-10-CM | POA: Diagnosis present

## 2018-09-12 DIAGNOSIS — D638 Anemia in other chronic diseases classified elsewhere: Secondary | ICD-10-CM | POA: Diagnosis present

## 2018-09-12 DIAGNOSIS — Z888 Allergy status to other drugs, medicaments and biological substances status: Secondary | ICD-10-CM

## 2018-09-12 DIAGNOSIS — B9561 Methicillin susceptible Staphylococcus aureus infection as the cause of diseases classified elsewhere: Secondary | ICD-10-CM

## 2018-09-12 DIAGNOSIS — I340 Nonrheumatic mitral (valve) insufficiency: Secondary | ICD-10-CM

## 2018-09-12 DIAGNOSIS — R011 Cardiac murmur, unspecified: Secondary | ICD-10-CM

## 2018-09-12 DIAGNOSIS — F1910 Other psychoactive substance abuse, uncomplicated: Secondary | ICD-10-CM | POA: Diagnosis present

## 2018-09-12 HISTORY — DX: Personal history of malignant neoplasm of ovary: Z85.43

## 2018-09-12 HISTORY — DX: Myositis, unspecified: M60.9

## 2018-09-12 HISTORY — DX: Other psychoactive substance abuse, uncomplicated: F19.10

## 2018-09-12 HISTORY — DX: Wernicke's encephalopathy: E51.2

## 2018-09-12 HISTORY — DX: Endocarditis, valve unspecified: I38

## 2018-09-12 HISTORY — DX: Unspecified viral hepatitis C without hepatic coma: B19.20

## 2018-09-12 LAB — COMPREHENSIVE METABOLIC PANEL
ALBUMIN: 2.6 g/dL — AB (ref 3.5–5.0)
ALT: 9 U/L (ref 0–44)
ANION GAP: 10 (ref 5–15)
AST: 12 U/L — ABNORMAL LOW (ref 15–41)
Alkaline Phosphatase: 78 U/L (ref 38–126)
BUN: 15 mg/dL (ref 6–20)
CALCIUM: 8.8 mg/dL — AB (ref 8.9–10.3)
CHLORIDE: 103 mmol/L (ref 98–111)
CO2: 24 mmol/L (ref 22–32)
Creatinine, Ser: 0.54 mg/dL (ref 0.44–1.00)
GFR calc non Af Amer: >60 mL/min (ref >60)
GLUCOSE: 97 mg/dL (ref 70–99)
POTASSIUM: 2.9 mmol/L — AB (ref 3.5–5.1)
SODIUM: 137 mmol/L (ref 135–145)
Total Bilirubin: 0.3 mg/dL (ref 0.3–1.2)
Total Protein: 7.5 g/dL (ref 6.5–8.1)

## 2018-09-12 LAB — URINALYSIS, ROUTINE W REFLEX MICROSCOPIC
Glucose, UA: NEGATIVE mg/dL
Hgb urine dipstick: NEGATIVE
Ketones, ur: NEGATIVE mg/dL
Nitrite: NEGATIVE
PH: 5 (ref 5.0–8.0)
Protein, ur: 100 mg/dL — AB
SPECIFIC GRAVITY, URINE: 1.032 — AB (ref 1.005–1.030)

## 2018-09-12 LAB — RAPID URINE DRUG SCREEN, HOSP PERFORMED
AMPHETAMINES: POSITIVE — AB
BARBITURATES: POSITIVE — AB
BENZODIAZEPINES: NOT DETECTED
COCAINE: POSITIVE — AB
Opiates: POSITIVE — AB
TETRAHYDROCANNABINOL: NOT DETECTED

## 2018-09-12 LAB — CBC WITH DIFFERENTIAL/PLATELET
Abs Immature Granulocytes: 0.05 10*3/uL (ref 0.00–0.07)
BASOS PCT: 1 %
Basophils Absolute: 0.1 10*3/uL (ref 0.0–0.1)
EOS ABS: 0.2 10*3/uL (ref 0.0–0.5)
Eosinophils Relative: 2 %
HEMATOCRIT: 27.1 % — AB (ref 36.0–46.0)
Hemoglobin: 8.2 g/dL — ABNORMAL LOW (ref 12.0–15.0)
Immature Granulocytes: 0 %
LYMPHS ABS: 2.2 10*3/uL (ref 0.7–4.0)
Lymphocytes Relative: 19 %
MCH: 27.8 pg (ref 26.0–34.0)
MCHC: 30.3 g/dL (ref 30.0–36.0)
MCV: 91.9 fL (ref 80.0–100.0)
MONOS PCT: 9 %
Monocytes Absolute: 1.1 10*3/uL — ABNORMAL HIGH (ref 0.1–1.0)
NEUTROS ABS: 8.4 10*3/uL — AB (ref 1.7–7.7)
NEUTROS PCT: 69 %
Platelets: 374 10*3/uL (ref 150–400)
RBC: 2.95 MIL/uL — AB (ref 3.87–5.11)
RDW: 19.9 % — AB (ref 11.5–15.5)
WBC: 12 10*3/uL — ABNORMAL HIGH (ref 4.0–10.5)
nRBC: 0 % (ref 0.0–0.2)

## 2018-09-12 LAB — MRSA PCR SCREENING: MRSA by PCR: NEGATIVE

## 2018-09-12 LAB — PREGNANCY, URINE: PREG TEST UR: NEGATIVE

## 2018-09-12 LAB — I-STAT CG4 LACTIC ACID, ED: Lactic Acid, Venous: 1.33 mmol/L (ref 0.5–1.9)

## 2018-09-12 MED ORDER — TRAMADOL-ACETAMINOPHEN 37.5-325 MG PO TABS: 1.0000 | ORAL_TABLET | ORAL | Status: DC | PRN

## 2018-09-12 MED ORDER — BUPRENORPHINE HCL-NALOXONE HCL 2-0.5 MG SL SUBL
2.0000 | SUBLINGUAL_TABLET | SUBLINGUAL | Status: DC | PRN
  Administered 2018-09-12 (×2): 2 via SUBLINGUAL
  Filled 2018-09-12 (×2): qty 2

## 2018-09-12 MED ORDER — THIAMINE HCL 100 MG/ML IJ SOLN
500.0000 mg | Freq: Three times a day (TID) | INTRAVENOUS | Status: AC
  Administered 2018-09-12 – 2018-09-15 (×9): 500 mg via INTRAVENOUS
  Filled 2018-09-12 (×10): qty 5

## 2018-09-12 MED ORDER — BUPRENORPHINE HCL-NALOXONE HCL 8-2 MG SL SUBL
1.0000 | SUBLINGUAL_TABLET | Freq: Two times a day (BID) | SUBLINGUAL | Status: DC
  Administered 2018-09-13: 1 via SUBLINGUAL
  Filled 2018-09-12 (×2): qty 1

## 2018-09-12 MED ORDER — ACETAMINOPHEN 650 MG RE SUPP: 650.0000 mg | Freq: Four times a day (QID) | RECTAL | Status: DC | PRN

## 2018-09-12 MED ORDER — VANCOMYCIN HCL IN DEXTROSE 1-5 GM/200ML-% IV SOLN
1000.0000 mg | Freq: Once | INTRAVENOUS | Status: AC
  Administered 2018-09-12: 1000 mg via INTRAVENOUS
  Filled 2018-09-12: qty 200

## 2018-09-12 MED ORDER — VANCOMYCIN HCL IN DEXTROSE 750-5 MG/150ML-% IV SOLN: 750.0000 mg | Freq: Two times a day (BID) | INTRAVENOUS | Status: DC

## 2018-09-12 MED ORDER — LOPERAMIDE HCL 2 MG PO CAPS: 2.0000 mg | ORAL_CAPSULE | ORAL | Status: AC | PRN

## 2018-09-12 MED ORDER — NAPROXEN 250 MG PO TABS
500.0000 mg | ORAL_TABLET | Freq: Two times a day (BID) | ORAL | Status: DC | PRN
  Administered 2018-09-12: 500 mg via ORAL
  Filled 2018-09-12: qty 2

## 2018-09-12 MED ORDER — SODIUM CHLORIDE 0.9% FLUSH
3.0000 mL | Freq: Two times a day (BID) | INTRAVENOUS | Status: DC
  Administered 2018-09-12 – 2018-09-21 (×13): 3 mL via INTRAVENOUS

## 2018-09-12 MED ORDER — HYDROXYZINE HCL 25 MG PO TABS
25.0000 mg | ORAL_TABLET | Freq: Four times a day (QID) | ORAL | Status: AC | PRN
  Administered 2018-09-12 – 2018-09-16 (×11): 25 mg via ORAL
  Filled 2018-09-12 (×11): qty 1

## 2018-09-12 MED ORDER — CEFAZOLIN SODIUM-DEXTROSE 2-4 GM/100ML-% IV SOLN
2.0000 g | Freq: Three times a day (TID) | INTRAVENOUS | Status: AC
  Administered 2018-09-12: 2 g via INTRAVENOUS
  Filled 2018-09-12: qty 100

## 2018-09-12 MED ORDER — METHOCARBAMOL 500 MG PO TABS
500.0000 mg | ORAL_TABLET | Freq: Three times a day (TID) | ORAL | Status: AC | PRN
  Administered 2018-09-12 – 2018-09-17 (×11): 500 mg via ORAL
  Filled 2018-09-12 (×11): qty 1

## 2018-09-12 MED ORDER — DICYCLOMINE HCL 20 MG PO TABS
20.0000 mg | ORAL_TABLET | Freq: Four times a day (QID) | ORAL | Status: AC | PRN
  Administered 2018-09-16: 20 mg via ORAL
  Filled 2018-09-12 (×3): qty 1

## 2018-09-12 MED ORDER — SODIUM CHLORIDE 0.9 % IV BOLUS
1000.0000 mL | Freq: Once | INTRAVENOUS | Status: AC
  Administered 2018-09-12: 1000 mL via INTRAVENOUS

## 2018-09-12 MED ORDER — TRAZODONE HCL 50 MG PO TABS
50.0000 mg | ORAL_TABLET | Freq: Every evening | ORAL | Status: DC | PRN
  Administered 2018-09-14 – 2018-09-20 (×6): 50 mg via ORAL
  Filled 2018-09-12 (×7): qty 1

## 2018-09-12 MED ORDER — ENOXAPARIN SODIUM 40 MG/0.4ML ~~LOC~~ SOLN: 40.0000 mg | SUBCUTANEOUS | Status: DC

## 2018-09-12 MED ORDER — HEPARIN SODIUM (PORCINE) 5000 UNIT/ML IJ SOLN
5000.0000 [IU] | Freq: Three times a day (TID) | INTRAMUSCULAR | Status: DC
  Administered 2018-09-12 – 2018-09-21 (×24): 5000 [IU] via SUBCUTANEOUS
  Filled 2018-09-12 (×24): qty 1

## 2018-09-12 MED ORDER — NICOTINE 14 MG/24HR TD PT24
14.0000 mg | MEDICATED_PATCH | Freq: Every day | TRANSDERMAL | Status: DC
  Administered 2018-09-12 – 2018-09-21 (×10): 14 mg via TRANSDERMAL
  Filled 2018-09-12 (×10): qty 1

## 2018-09-12 MED ORDER — CEFAZOLIN SODIUM-DEXTROSE 2-4 GM/100ML-% IV SOLN
2.0000 g | Freq: Three times a day (TID) | INTRAVENOUS | Status: DC
  Administered 2018-09-13: 2 g via INTRAVENOUS
  Filled 2018-09-12 (×2): qty 100

## 2018-09-12 MED ORDER — ONDANSETRON HCL 4 MG/2ML IJ SOLN: 4.0000 mg | Freq: Four times a day (QID) | INTRAMUSCULAR | Status: DC | PRN

## 2018-09-12 MED ORDER — ACETAMINOPHEN 325 MG PO TABS
650.0000 mg | ORAL_TABLET | Freq: Four times a day (QID) | ORAL | Status: DC | PRN
  Administered 2018-09-14 – 2018-09-19 (×3): 650 mg via ORAL
  Filled 2018-09-12 (×3): qty 2

## 2018-09-12 MED ORDER — TRAMADOL-ACETAMINOPHEN 37.5-325 MG PO TABS
1.0000 | ORAL_TABLET | ORAL | Status: DC | PRN
  Administered 2018-09-12 – 2018-09-21 (×31): 1 via ORAL
  Filled 2018-09-12 (×32): qty 1

## 2018-09-12 MED ORDER — ONDANSETRON HCL 4 MG PO TABS
4.0000 mg | ORAL_TABLET | Freq: Four times a day (QID) | ORAL | Status: DC | PRN
  Administered 2018-09-12: 4 mg via ORAL
  Filled 2018-09-12: qty 1

## 2018-09-12 MED ORDER — MUSCLE RUB 10-15 % EX CREA
TOPICAL_CREAM | CUTANEOUS | Status: DC | PRN
  Administered 2018-09-14: 1 via TOPICAL
  Administered 2018-09-16: 11:00:00 via TOPICAL
  Filled 2018-09-12: qty 85

## 2018-09-12 NOTE — ED Triage Notes (Addendum)
Pt to triage via [facility] for bilateral leg pain and neck pain.  Reports treated for endocarditis at [facility] within the last month and left AMA.  On arrival BP 84/54 with small adult cuff.  Last heroin use Sunday morning.

## 2018-09-12 NOTE — Progress Notes (Signed)
   09/12/18 1000  Clinical Encounter Type  Visited With Patient;Health care provider  Visit Type Initial;ED  Referral From Physician  Spiritual Encounters  Spiritual Needs Other (Comment) (Dr req to assess spiritual needs)  Stress Factors  Patient Stress Factors Exhausted   Attempted to assess spiritual needs per physician request, however pt not currently cooperative to converse.  Pt indicated annoyance that "everyone keeps coming in asking the same questions."  Request new consult if pt later seems more likely to interact.  Margretta Sidle resident, [phone]

## 2018-09-12 NOTE — ED Notes (Signed)
Patient transported to X-ray 

## 2018-09-12 NOTE — Consult Note (Signed)
[facility]    Date of Admission:  09/12/2018     Total days of antibiotics                Reason for Consult: Staph Aureus Bacteremia / Endocarditis  Referring Provider: Ophelia CharterYates Primary Care Provider: Patient, No Pcp Per   Assessment/Plan:  Ms. 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 Now with continued back and calf pain. Urine drug screen was positive. Question if this is MSSA or MRSA infection as she was only treated for about 4 weeks back in July 2019 for MRSA tricuspid valve endocarditis with most recent blood cultures positive for MSSA. Plan to start on vancomycin which will cover for MSSA and MRSA pending blood cultures that were drawn today.  She will need prolonged therapy of at least 6-8 weeks of IV therapy and has been informed she will need to stay in the hospital for that duration of time. At present she is willing, however given her previous patterns, I am concerned she will not stay.    1. Start vancomycin.  2. Monitor cultures and narrow antibiotics as able. 3. Pain management per primary team.        [place] Antimicrobial Management Team Staphylococcus aureus bacteremia   Staphylococcus aureus bacteremia (SAB) is associated with a high rate of complications and mortality.  Specific aspects of clinical management are critical to optimizing the outcome of patients with SAB.  Therefore, the [facility] Management Team [facility]) has initiated an intervention aimed at improving the management of SAB at [facility].  To do so, Infectious Diseases physicians are providing an evidence-based consult for the management of all patients with SAB.     Yes No Comments  Perform follow-up blood cultures (even if the patient is afebrile) to ensure clearance of  bacteremia [x]  []  Admission cultures pending - previous 12/27 negative  Remove vascular catheter and obtain follow-up blood cultures after the removal of the catheter [x]  []  None present  Perform echocardiography to evaluate for endocarditis (transthoracic ECHO is 40-50% sensitive, TEE is > 90% sensitive) []  []  Please keep in mind, that neither test can definitively EXCLUDE endocarditis, and that should clinical suspicion remain high for endocarditis the patient should then still be treated with an "endocarditis" duration of therapy = 6 weeks  Consult electrophysiologist to evaluate implanted cardiac device (pacemaker, ICD) []  [x]    Ensure source control [x]  []  Have all abscesses been drained effectively? Have deep seeded infections (septic joints or osteomyelitis) had appropriate surgical debridement?  Investigate for "metastatic" sites of infection [x]  []  Does the patient have ANY symptom or physical exam finding that would suggest a deeper infection (back or neck pain that may be suggestive of vertebral osteomyelitis or epidural abscess, muscle pain that could be a symptom of pyomyositis)?  Keep in mind that for deep seeded infections MRI imaging with contrast is preferred rather than other often insensitive tests such as plain x-rays, especially early in a patient's presentation.  Change antibiotic therapy to __________________ [x]  []  Vancomycin pending culture  Estimated duration of IV antibiotic therapy:   []  []  6-8 weeks      Principal Problem:   Infective endocarditis Active Problems:   MRSA bacteremia   Tobacco user   Opiate dependence (HCC)   Wernicke encephalopathy  IV drug abuse (HCC)   Myositis   . [START ON 09/13/2018] buprenorphine-naloxone  1 tablet Sublingual BID  . heparin injection (subcutaneous)  5,000 Units Subcutaneous Q8H  . nicotine  14 mg Transdermal Daily  . sodium chloride flush  3 mL Intravenous Q12H     HPI: Mackenzie Hughes is a 38 y.o. female with  previous medical history significant for IV drug use, Wernicke encephalopathy, MRSA tricuspid valve endocarditis (July 2019 - treated for 30 days with vancomycin and left AMA) and recent diagnosis of mitral valve endocarditis, myositis and MSSA bacteremia presenting with leg and neck pain following [facility] 09/07[facility].   Per chart review she has previously been seen at multiple hospitals including [facility] ([facility]/18), [facility] (08/[facility]-08/[facility]/18), and then presented to [facility] (09/03/18-09/07/18) leaving AMA from each of these facilities. At [facility] she was noted to have MSSA bacteremia, myositis of her bilateral calves, pulmonary emboli, cerebral abscess, and renal and splenic infarcts. She was placed on oxacillin for antimicrobial therapy with blood cultures from 09/02/18 without growth. [facility] CT [facility] with need for mitral valve replacement however deemed not a good candidate with continued IV drug usage.    Review of imaging from outside facility:  MRI of Tibia/Fibula: the bilateral tibia/fibula with abnormal signal throughout the muscles of the posterior compartment concerning for myositis or early myonecrosis;  MRI of thoracic/lumbar spine: No evidence of fracture or signs of discitis or osteomyelitis.  TEE: Moderate size vegetation or mass on the mitral valve without stenosis and severe regurgitation.   MRI of the head: Numerous patchy, dispersed infratentorial and supratentorial lesions with some showing ring enhancement possible representing scattered abscess or small infarcts.   Now presenting with continued back pain and leg pain. Afebrile in the ED with no evidence of sepsis. Blood cultures have been obtained and are pending. Urine drug screen was positive for amphetamines, barbiturates, opiates and cocaine. She is agitated at the time of this interview so primary source of information is from chart review.   Review of Systems: Review of Systems    Constitutional: Positive for malaise/fatigue. Negative for chills and fever.  Respiratory: Negative for cough, shortness of breath and wheezing.   Cardiovascular: Negative for chest pain and leg swelling.  Gastrointestinal: Negative for abdominal pain, diarrhea, nausea and vomiting.  Musculoskeletal: Positive for back pain.       Positive for bilateral calf pain.   Skin: Negative for rash.     Past Medical History:  Diagnosis Date  . Endocarditis   . Hepatitis C infection   . IV drug abuse (HCC)   . Myositis   . Ovarian cancer in remission   . Wernicke encephalopathy     Social History   Tobacco Use  . Smoking status: Heavy Tobacco Smoker    Packs/day: 0.50    Types: Cigarettes  . Smokeless tobacco: Never Used  . Tobacco comment: needs a patch  Substance Use Topics  . Alcohol use: No  . Drug use: Yes    Types: IV, Heroin    Comment: denies oral opiates    No family history on file.  Allergies  Allergen Reactions  . Toradol [Ketorolac Tromethamine] Other (See Comments)    GI upset  . Tramadol Other (See Comments)    GI upset    OBJECTIVE: Blood pressure 106/83, pulse 71, temperature 97.7 F (36.5 C), temperature source Oral, resp. rate 15, SpO2 99 %.  Physical Exam Constitutional:  General: She is not in acute distress.    Appearance: She is cachectic. She is ill-appearing.     Comments: Lying in bed resting.   Cardiovascular:     Rate and Rhythm: Normal rate and regular rhythm.     Heart sounds: Murmur present. Systolic murmur present.  Pulmonary:     Effort: Pulmonary effort is normal.     Breath sounds: Normal breath sounds. No stridor. No wheezing, rhonchi or rales.  Chest:     Chest wall: No tenderness.  Musculoskeletal:        General: Tenderness (Generalized tenderness of bilateral calves) present.  Skin:    General: Skin is warm and dry.  Neurological:     Mental Status: She is oriented to person, place, and time and easily aroused.   Psychiatric:        Behavior: Behavior is agitated.     Lab Results Lab Results  Component Value Date   WBC 12.0 (H) 09/12/2018   HGB 8.2 (L) 09/12/2018   HCT 27.1 (L) 09/12/2018   MCV 91.9 09/12/2018   PLT 374 09/12/2018    Lab Results  Component Value Date   CREATININE 0.54 09/12/2018   BUN 15 09/12/2018   NA 137 09/12/2018   K 2.9 (L) 09/12/2018   CL 103 09/12/2018   CO2 24 09/12/2018    Lab Results  Component Value Date   ALT 9 09/12/2018   AST 12 (L) 09/12/2018   ALKPHOS 78 09/12/2018   BILITOT 0.3 09/12/2018     Microbiology: No results found for this or any previous visit (from the past 240 hour(s)).   Marcos Eke, NP [facility] [phone] Pager  09/12/2018  12:33 PM

## 2018-09-12 NOTE — H&P (Addendum)
History and Physical    Mackenzie Hughes HYQ:[id number] DOB: Jun 09, 1981 DOA: 09/12/2018  PCP: Patient, No Pcp Per Consultants:  None Patient coming from:  Homeless; NOK: Boyfriend, no phone  Chief Complaint: leg pain  HPI: Mackenzie HoldingCarla T Zara is a 38 y.o. female with medical history significant of IVDA; Hep C; remote ovarian CA; Wernicke's encephalopathy (7/19); and MRSA mitral valve endocarditis.  [facility]; [facility] and left AMA 12/20.  Her blood culture was still positive as of 08/23/18 and she had septic pulmonary emboli as well as cerebral abscesses and renal and splenic infarctions.  [facility] and her UDS was + for amphetamines and opiates at that time.   She took home medications while hospitalized, presumably oxycodone.  There was concern about LLE myositis or possible early myonecrosis.  The patient appears to have left AMA on 1/1.  She has nowhere to go and no one to tell.  Both of her legs are hurting in the calf regions.  She injects in her arms with last use this AM.  Not really having any chest pain.  No current SOB.  She does want to quit using.  She has used Suboxone in the past and would like to start it again.She doesn't want to go through withdrawal but she wants to start it anyway.   ED Course:  Carryover, per Dr. Katrinka BlazingSmith:  pmh of IVDA(heroin), hepatitis C, history of ovarian cancer 2009, septic PE/ splenic infarcts, history of MRSA MV endocarditis, and multiple hospitalizations leaving AMA; who presents with pain in back and legs per EMS.   bld cx [facility] on 12[facility]17 [facility], but neg on bld cx 12/27 Myositis or possible early myonecrosis of the left leg by [facility]  Review of Systems: As per HPI; otherwise review of systems reviewed and negative.   Ambulatory Status:  Ambulates without assistance  Past Medical History:  Diagnosis Date  . Endocarditis   .  Hepatitis C infection   . IV drug abuse (HCC)   . Myositis   . Ovarian cancer in remission   . Wernicke encephalopathy     Past Surgical History:  Procedure Laterality Date  . TONSILLECTOMY    . TUBAL LIGATION      Social History   Socioeconomic History  . Marital status: Single    Spouse name: Not on file  . Number of children: Not on file  . Years of education: Not on file  . Highest education level: Not on file  Occupational History  . Not on file  Social Needs  . Financial resource strain: Not on file  . Food insecurity:    Worry: Not on file    Inability: Not on file  . Transportation needs:    Medical: Not on file    Non-medical: Not on file  Tobacco Use  . Smoking status: Heavy Tobacco Smoker    Packs/day: 0.50    Types: Cigarettes  . Smokeless tobacco: Never Used  . Tobacco comment: needs a patch  Substance and Sexual Activity  . Alcohol use: No  . Drug use: Yes    Types: IV, Heroin    Comment: denies oral opiates  . Sexual activity: Yes    Birth control/protection: Surgical  Lifestyle  . Physical activity:    Days per week: Not on file    Minutes per session: Not on file  .  Stress: Not on file  Relationships  . Social connections:    Talks on phone: Not on file    Gets together: Not on file    Attends religious service: Not on file    Active member of club or organization: Not on file    Attends meetings of clubs or organizations: Not on file    Relationship status: Not on file  . Intimate partner violence:    Fear of current or ex partner: Not on file    Emotionally abused: Not on file    Physically abused: Not on file    Forced sexual activity: Not on file  Other Topics Concern  . Not on file  Social History Narrative  . Not on file    Allergies  Allergen Reactions  . Toradol [Ketorolac Tromethamine] Other (See Comments)    GI upset  . Tramadol Other (See Comments)    GI upset    No family history on file.  Prior to Admission  medications   Medication Sig Start Date End Date Taking? Authorizing Provider  sulfamethoxazole-trimethoprim (BACTRIM DS) 800-160 MG tablet Take 1 tablet by mouth 2 (two) times daily. Patient not taking: Reported on 03/06/2018 01/21/16   Wynetta Emeryisciotta, Nicole, PA-C    Physical Exam: Vitals:   09/12/18 0500 09/12/18 0515 09/12/18 0804 09/12/18 0815  BP: 99/78 103/77 93/74 96/74   Pulse: (!) 57 68 68 66  Resp: 18 17  15   Temp:      TempSrc:      SpO2: 98% 99% 98% 99%     General:  Appears calm and comfortable and is NAD; she is currently resting nicely and appears cachectic and chronically ill Eyes:  PERRL, EOMI, normal lids, iris ENT:  grossly normal hearing, lips & tongue, mmm; poor dentition Neck:  no LAD, masses or thyromegaly Cardiovascular:  RRR, no r/g. 3/6 systolic murmur noted.  No LE edema.  Respiratory:   CTA bilaterally with no wheezes/rales/rhonchi.  Normal respiratory effort. Abdomen:  soft, NT, ND, NABS Back:   normal alignment, no CVAT Skin:  no rash or induration seen on limited exam; various marks along B arms Musculoskeletal:  grossly normal tone BUE/BLE, good ROM, no bony abnormality; she does have marked TTP of both calves with palpation Lower extremity:  No LE edema.  Limited foot exam with no ulcerations.  2+ distal pulses. Psychiatric:  flat mood and affect, speech fluent and appropriate, AOx3 Neurologic:  CN 2-12 grossly intact, moves all extremities in coordinated fashion, sensation intact    Radiological Exams on Admission: No results found.  EKG: Independently reviewed.  NSR with rate 68; low voltage, LVH with no evidence of acute ischemia   Labs on Admission: I have personally reviewed the available labs and imaging studies at the time of the admission.  Pertinent labs:   WBC 12.0 Hgb 8.2 Lactate 1.33 Upreg negative UA: small bili, moderate LE, 100 protein, rare bacteria USD: + amphetamines, barbiturates, opiates, cocaine Blood cultures  pending  Assessment/Plan Principal Problem:   Opiate dependence (HCC) Active Problems:   MRSA bacteremia   Infective endocarditis   Tobacco user   Wernicke encephalopathy   IV drug abuse (HCC)   Myositis   IV drug abuse with heroin dependence -Long-standing heroin dependence -She has a pattern of leaving AMA and returning to the ER -She appears to already be starting to withdraw -Will initiate suboxone therapy now; while this may precipitate early withdrawal, hopefully she can reach a level of withdrawal symptom relief  that will enable her to remain hospitalized and complete treatment -Suboxone protocol implemented -Will monitor on COWS protocol -prn orders from the Clonidine withdrawal order set were also ordered. -Note from most recent admission at [facility]: "Overall, pt unfortunately with poor prognosis given her recurrent infections, malnutrition, continued subs use, med/tx noncompliance in setting of poor insight and judgement in regards to subs use.... Pt notes that she knows that she's here for endocarditis again. States that she's on long-term IV abx. Says that she if were to not get the abx, she knows that she'll die. She plans to stay in hosp to get IV abx." -She left that hospitalization AMA -She again reports desire for long-term help and to quit using substances  Recent MRSA bacteremia with endocarditis -08/17/18 Echo with "large echodensity (1.5 x 2.0 cm) on the posterior mitral valve leaflet consistent with a vegetation. (not all that different than the previous echo in July)." with severe (4+) mitral regurgitation -Blood cultures were most recently positive as of 08/23/18 as well as 08/17/18  with Staph that was resistant to Clinda and Erythromycin.  It was not MRSA at that time. -Blood cultures from 12/27 were negative. -Most recent documented MRSA cultures were from 03/12/18 and 03/16/18. -ID consult requested; will defer antibiotics pending ID input. -Current blood  cultures are pending -She has been placed empirically on contact precautions pending current blood cultures. -There does not appear to be an indication to repeat echo at this time, but will defer to ID.  Wernicke encephalopathy -Patient previously diagnosed with Wernicke's -She was treated with IV thiamine, although it is not clear that she completed her entire course  -Will initiate treatment with thiamine for now 500 mg IV TID x 3 days and then transition to 100 mg IV/PO daily  Myositis -Patient with concern for myositis or early myonecrosis of the left calf during her last admission -I have spoken with Dale Altona, who recommends initial B (pain is now B) plain films.  If negative, B MRI. -If abnormal imaging, will then plan to consult orthopedics.  Tobacco dependence -Encourage cessation.   -This was discussed with the patient and should be reviewed on an ongoing basis.   -Patch ordered at patient request.   DVT prophylaxis:  SQ Heparin Code Status:  Full - confirmed with patient Family Communication: None present Disposition Plan:  Home once clinically improved Consults called: Orthopedics (telephone only); ID; SW; CM; spiritual care  Admission status: Admit - It is my clinical opinion that admission to INPATIENT is reasonable and necessary because of the expectation that this patient will require hospital care that crosses at least 2 midnights to treat this condition based on the medical complexity of the problems presented.  Given the aforementioned information, the predictability of an adverse outcome is felt to be significant.    Jonah Blue MD [facility]  If note is complete, please contact covering daytime or nighttime physician. [email or web address] Password [email or web address]  09/12/2018, 10:40 AM

## 2018-09-12 NOTE — Progress Notes (Signed)
Pharmacy Antibiotic Note  Mackenzie Hughes is a 38 y.o. female admitted on 09/12/2018 with bacteremia - recent MSSA IE (also history of MRSA in past).  Pharmacy has been consulted for Ancef dosing per ID. Vancomycin discontinued.  WBC slightly elevated. SCr stable on 1/5. Afebrile currently.   Plan: Ancef 2g IV every 8 hours Monitor clinical status, culture results, and renal function.   Height: 5\' 5"  (165.1 cm) Weight: 92 lb 6.4 oz (41.9 kg) IBW/kg (Calculated) : 57  Temp (24hrs), Avg:97.8 F (36.6 C), Min:97.7 F (36.5 C), Max:97.9 F (36.6 C)  Recent Labs  Lab 09/12/18 0249 09/12/18 0257  WBC 12.0*  --   CREATININE 0.54  --   LATICACIDVEN  --  1.33    Estimated Creatinine Clearance: 64.9 mL/min (by C-G formula based on SCr of 0.54 mg/dL).    Allergies  Allergen Reactions  . Toradol [Ketorolac Tromethamine] Other (See Comments)    GI upset  . Tramadol Other (See Comments)    GI upset    Antimicrobials this admission: Vancomycin 1/6 >> 1/6 Ancef 1/6 >>  Dose adjustments this admission:   Microbiology results: 1/6 BCx >> 1/6 MRSA PCR >>  Thank you for allowing pharmacy to be a part of this patient's care.  Link Snuffer, PharmD, BCPS, BCCCP Clinical Pharmacist Please refer to [facility] for [facility] numbers 09/12/2018 5:37 PM

## 2018-09-12 NOTE — ED Notes (Signed)
1st attempt to call report to 2w, RN in pt room and will call back

## 2018-09-12 NOTE — ED Provider Notes (Signed)
[facility] EMERGENCY DEPARTMENT Provider Note   CSN: [id number] Arrival date & time: 09/12/18  0210     History   Chief Complaint Chief Complaint  Patient presents with  . Leg Pain  . Neck Pain    HPI Mackenzie Hughes is a 38 y.o. female.  Patient is a 38 year old female with history of IV drug abuse, hepatitis C, endocarditis.  She has been admitted several times recently to different facilities for endocarditis and myositis of her calf muscles.  Each of these admissions have resulted in her leaving AGAINST MEDICAL ADVICE.  She denies to me she is experiencing any fevers or chills.  The history is provided by the patient.  Leg Pain   This is a recurrent problem. The problem occurs constantly. The problem has been rapidly worsening. The pain is moderate. Pertinent negatives include no numbness and full range of motion. The symptoms are aggravated by activity. She has tried nothing for the symptoms.  Neck Pain   Associated symptoms include leg pain. Pertinent negatives include no numbness.    Past Medical History:  Diagnosis Date  . Endocarditis     Patient Active Problem List   Diagnosis Date Noted  . Pressure injury of skin 03/07/2018  . Tobacco user 03/07/2018  . Acute pain of right shoulder 03/07/2018  . MRSA bacteremia 03/06/2018  . Anemia 03/06/2018  . Severe sepsis with septic shock (HCC) 03/06/2018  . Chronic hepatitis C without hepatic coma (HCC) 03/06/2018  . IV drug user 03/06/2018  . Infective endocarditis 03/06/2018    Past Surgical History:  Procedure Laterality Date  . TONSILLECTOMY    . TUBAL LIGATION       OB History   No obstetric history on file.      Home Medications    Prior to Admission medications   Medication Sig Start Date End Date Taking? Authorizing Provider  sulfamethoxazole-trimethoprim (BACTRIM DS) 800-160 MG tablet Take 1 tablet by mouth 2 (two) times daily. Patient not taking: Reported on 03/06/2018  01/21/16   Pisciotta, Mardella Layman    Family History No family history on file.  Social History Social History   Tobacco Use  . Smoking status: Heavy Tobacco Smoker    Packs/day: 0.50  . Smokeless tobacco: Never Used  Substance Use Topics  . Alcohol use: No  . Drug use: Yes    Types: IV    Comment: heroin     Allergies   Toradol [ketorolac tromethamine] and Tramadol   Review of Systems Review of Systems  Musculoskeletal: Positive for neck pain.  Neurological: Negative for numbness.  All other systems reviewed and are negative.    Physical Exam Updated Vital Signs BP (!) 93/57   Pulse 75   Temp 97.7 F (36.5 C) (Oral)   Resp 18   SpO2 100%   Physical Exam Vitals signs and nursing note reviewed.  Constitutional:      General: She is not in acute distress.    Appearance: She is well-developed. She is not diaphoretic.     Comments: Patient is awake and alert.  She is very thin and appears older than stated age.  HENT:     Head: Normocephalic and atraumatic.     Mouth/Throat:     Mouth: Mucous membranes are moist.  Neck:     Musculoskeletal: Normal range of motion and neck supple.  Cardiovascular:     Rate and Rhythm: Normal rate and regular rhythm.     Heart sounds:  Murmur present. No friction rub. No gallop.      Comments: There is a 3 out of 6 systolic ejection murmur heard best at the left lower sternal border. Pulmonary:     Effort: Pulmonary effort is normal. No respiratory distress.     Breath sounds: Normal breath sounds. No wheezing.  Abdominal:     General: Bowel sounds are normal. There is no distension.     Palpations: Abdomen is soft.     Tenderness: There is no abdominal tenderness.  Musculoskeletal: Normal range of motion.     Comments: There is tenderness to palpation of both calves.  There is no leg edema.  Skin:    General: Skin is warm and dry.  Neurological:     Mental Status: She is alert and oriented to person, place, and time.       ED Treatments / Results  Labs (all labs ordered are listed, but only abnormal results are displayed) Labs Reviewed  CULTURE, BLOOD (ROUTINE X 2)  CULTURE, BLOOD (ROUTINE X 2)  COMPREHENSIVE METABOLIC PANEL  CBC WITH DIFFERENTIAL/PLATELET  URINALYSIS, ROUTINE W REFLEX MICROSCOPIC  RAPID URINE DRUG SCREEN, HOSP PERFORMED  PREGNANCY, URINE  I-STAT CG4 LACTIC ACID, ED    EKG None  Radiology No results found.  Procedures Procedures (including critical care time)  Medications Ordered in ED Medications  sodium chloride 0.9 % bolus 1,000 mL (has no administration in time range)     Initial Impression / Assessment and Plan / ED Course  I have reviewed the triage vital signs and the nursing notes.  Pertinent labs & imaging results that were available during my care of the patient were reviewed by me and considered in my medical decision making (see chart for details).  Patient with history of IV drug abuse and known endocarditis with mitral valve vegetation.  She has signed out AGAINST MEDICAL ADVICE from multiple facilities in the past month and presents here with ongoing leg and back pain.  Blood cultures were obtained.  Laboratory studies reveal a white count of 12,000, however she is afebrile and lactate is normal.  She is somewhat hypotensive, however upon review of her record from the previous facilities on care everywhere, her blood pressure seldom exceeds 100 systolic.  I suspect that her hypotension here is her baseline.  Regardless she was given 2 L of normal saline.  Care was discussed with Dr. Katrinka Blazing from the hospitalist service who agrees to admit.  Final Clinical Impressions(s) / ED Diagnoses   Final diagnoses:  None    ED Discharge Orders    None       Geoffery Lyons, MD 09/12/18 [phone]

## 2018-09-12 NOTE — Progress Notes (Addendum)
Pharmacy Antibiotic Note  Mackenzie Hughes is a 38 y.o. female admitted on 09/12/2018 with inadequately treated S. aureus endocarditis. PMH s/f IVDU. Patient has been admitted to several times to different hospitals over the past 6 months for S. aureus endocarditis, most recently in 08/2018 for MSSA and also in 03/2018 for MRSA. In July, she was treated from 03/12/18-04/10/18 with vancomycin before leaving AMA. In December, she was treated from 08/17/18-08/26/18 with oxacillin before again leaving AMA. She presented again briefly to another OSH on 12/27, but left AMA on 1/1.  Last BCx from 12/27 were negative.   Given history of both MRSA and MSSA infection without completing a full treatment course, will empirically cover for MRSA despite cultures showing MSSA at most recent OSH admission. Pharmacy has been consulted for vancomycin dosing.  Plan: Vancomycin 1000mg  IV x1, followed by 750mg  IV q12h (estimated AUC 505, goal 400-550) F/u C&S, clinical status, renal function, de-escalation, vancomycin levels as appropriate  Temp (24hrs), Avg:97.7 F (36.5 C), Min:97.7 F (36.5 C), Max:97.7 F (36.5 C)  Recent Labs  Lab 09/12/18 0249 09/12/18 0257  WBC 12.0*  --   CREATININE 0.54  --   LATICACIDVEN  --  1.33    CrCl cannot be calculated (Unknown ideal weight.).    Allergies  Allergen Reactions  . Toradol [Ketorolac Tromethamine] Other (See Comments)    GI upset  . Tramadol Other (See Comments)    GI upset   Antimicrobials this admission: Vancomycin 1/6 >>   Dose adjustments this admission: None  Microbiology results: 1/6 BCx: pending  Thank you for allowing pharmacy to be a part of this patient's care.  Roderic Scarce Zigmund Daniel, PharmD, BCPS PGY2 Infectious Diseases Pharmacy Resident Phone: [phone] 09/12/2018 11:28 AM

## 2018-09-13 DIAGNOSIS — I339 Acute and subacute endocarditis, unspecified: Secondary | ICD-10-CM

## 2018-09-13 DIAGNOSIS — I340 Nonrheumatic mitral (valve) insufficiency: Secondary | ICD-10-CM

## 2018-09-13 DIAGNOSIS — K469 Unspecified abdominal hernia without obstruction or gangrene: Secondary | ICD-10-CM

## 2018-09-13 DIAGNOSIS — E43 Unspecified severe protein-calorie malnutrition: Secondary | ICD-10-CM

## 2018-09-13 DIAGNOSIS — G0490 Encephalitis and encephalomyelitis, unspecified: Secondary | ICD-10-CM

## 2018-09-13 DIAGNOSIS — F1190 Opioid use, unspecified, uncomplicated: Secondary | ICD-10-CM

## 2018-09-13 LAB — RPR: RPR Ser Ql: NONREACTIVE

## 2018-09-13 LAB — MAGNESIUM: Magnesium: 1.8 mg/dL (ref 1.7–2.4)

## 2018-09-13 LAB — BASIC METABOLIC PANEL
Anion gap: 7 (ref 5–15)
BUN: 16 mg/dL (ref 6–20)
CO2: 27 mmol/L (ref 22–32)
Calcium: 8.1 mg/dL — ABNORMAL LOW (ref 8.9–10.3)
Chloride: 106 mmol/L (ref 98–111)
Creatinine, Ser: 0.6 mg/dL (ref 0.44–1.00)
GFR calc Af Amer: >60 mL/min (ref >60)
GFR calc non Af Amer: >60 mL/min (ref >60)
GLUCOSE: 97 mg/dL (ref 70–99)
Potassium: 3.5 mmol/L (ref 3.5–5.1)
Sodium: 140 mmol/L (ref 135–145)

## 2018-09-13 LAB — CBC
HCT: 28 % — ABNORMAL LOW (ref 36.0–46.0)
HEMOGLOBIN: 8 g/dL — AB (ref 12.0–15.0)
MCH: 26.2 pg (ref 26.0–34.0)
MCHC: 28.6 g/dL — AB (ref 30.0–36.0)
MCV: 91.8 fL (ref 80.0–100.0)
Platelets: 364 10*3/uL (ref 150–400)
RBC: 3.05 MIL/uL — AB (ref 3.87–5.11)
RDW: 20 % — ABNORMAL HIGH (ref 11.5–15.5)
WBC: 9.5 10*3/uL (ref 4.0–10.5)
nRBC: 0 % (ref 0.0–0.2)

## 2018-09-13 LAB — GLUCOSE, CAPILLARY
Glucose-Capillary: 90 mg/dL (ref 70–99)
Glucose-Capillary: 126 mg/dL — ABNORMAL HIGH (ref 70–99)
Glucose-Capillary: 121 mg/dL — ABNORMAL HIGH (ref 70–99)
Glucose-Capillary: 140 mg/dL — ABNORMAL HIGH (ref 70–99)

## 2018-09-13 LAB — POTASSIUM: Potassium: 3.4 mmol/L — ABNORMAL LOW (ref 3.5–5.1)

## 2018-09-13 MED ORDER — NAFCILLIN SODIUM 2 G IJ SOLR
2.0000 g | INTRAMUSCULAR | Status: DC
  Filled 2018-09-13 (×2): qty 2000

## 2018-09-13 MED ORDER — FAMOTIDINE 20 MG PO TABS
20.0000 mg | ORAL_TABLET | Freq: Every day | ORAL | Status: DC
  Administered 2018-09-13 – 2018-09-21 (×9): 20 mg via ORAL
  Filled 2018-09-13 (×9): qty 1

## 2018-09-13 MED ORDER — KETOROLAC TROMETHAMINE 15 MG/ML IJ SOLN
15.0000 mg | Freq: Three times a day (TID) | INTRAMUSCULAR | Status: AC | PRN
  Administered 2018-09-13 – 2018-09-18 (×13): 15 mg via INTRAVENOUS
  Filled 2018-09-13 (×13): qty 1

## 2018-09-13 MED ORDER — SODIUM CHLORIDE 0.9 % IV SOLN
2.0000 g | INTRAVENOUS | Status: DC
  Administered 2018-09-13 – 2018-09-21 (×48): 2 g via INTRAVENOUS
  Filled 2018-09-13 (×53): qty 2000

## 2018-09-13 MED ORDER — BUPRENORPHINE HCL-NALOXONE HCL 2-0.5 MG SL SUBL
2.0000 | SUBLINGUAL_TABLET | Freq: Four times a day (QID) | SUBLINGUAL | Status: DC
  Administered 2018-09-13 – 2018-09-18 (×19): 2 via SUBLINGUAL
  Filled 2018-09-13 (×4): qty 2
  Filled 2018-09-13: qty 1
  Filled 2018-09-13 (×12): qty 2
  Filled 2018-09-13: qty 1
  Filled 2018-09-13 (×2): qty 2

## 2018-09-13 MED ORDER — ENSURE ENLIVE PO LIQD
237.0000 mL | Freq: Three times a day (TID) | ORAL | Status: DC
  Administered 2018-09-13 – 2018-09-20 (×19): 237 mL via ORAL

## 2018-09-13 NOTE — Progress Notes (Signed)
Notified by Infectious Disease Nurse that patients contact isolation could be discontinued as MRSA swab was negative.

## 2018-09-13 NOTE — Progress Notes (Signed)
Initial Nutrition Assessment  DOCUMENTATION CODES:   Severe malnutrition in context of chronic illness  INTERVENTION:   Ensure Enlive po BID, each supplement provides 350 kcal and 20 grams of protein  Afternoon and bedtime snacks  Reviewed menu ordering   NUTRITION DIAGNOSIS:   Severe Malnutrition related to chronic illness(endocarditis and IVDA) as evidenced by energy intake < or equal to 75% for > or equal to 1 month, severe fat depletion, severe muscle depletion.  GOAL:   Patient will meet greater than or equal to 90% of their needs  MONITOR:   PO intake, Supplement acceptance  REASON FOR ASSESSMENT:   Malnutrition Screening Tool    ASSESSMENT:   Pt with PMH of homelessness, IVDA, hepatitis C, remote ovarian cancer, wernicke's encephalopathy (7/19), MV endocarditis, multiple hospitalizations leaving AMA who is now admitted with MSSA infection, cerebritis, bilateral calf myositis, splenic and renal infarcts d/t drug use with plans for 6 weeks of IV abx.    Pt reports that prior to her heroin use she weighed 110 lb. This started at least 2 years ago. She is now down to 92 lb (16% weight loss).  She states that on days that she uses (every other day) she does not eat at all. On days that she does not use she either wants to eat or she feels to terrible to eat at all.  Per pt when she is hospitalized she usually has a great appetite. She likes ensure and boost and is willing to drink them. She would also like to have snacks.   Per chart review she has has MRSA tricuspid valve endocarditis that was only partially treated 03/2018.                    Meal Completion: 100% Medications reviewed and include: thiamine 500 mg TID IV Labs reviewed    NUTRITION - FOCUSED PHYSICAL EXAM:    Most Recent Value  Orbital Region  Unable to assess [edema]  Upper Arm Region  Severe depletion  Thoracic and Lumbar Region  Severe depletion  Buccal Region  Moderate depletion  Temple  Region  Moderate depletion  Clavicle Bone Region  Severe depletion  Clavicle and Acromion Bone Region  Severe depletion  Scapular Bone Region  Severe depletion  Dorsal Hand  Moderate depletion  Patellar Region  Severe depletion  Anterior Thigh Region  Severe depletion  Posterior Calf Region  Severe depletion  Edema (RD Assessment)  None  Hair  Reviewed [more course and falling out, dull]  Eyes  Reviewed  Mouth  Reviewed [missing teeth]  Skin  Reviewed  Nails  Reviewed       Diet Order:   Diet Order            Diet regular Room service appropriate? Yes; Fluid consistency: Thin  Diet effective now              EDUCATION NEEDS:   Education needs have been addressed  Skin:  Skin Assessment: Reviewed RN Assessment  Last BM:  1/5  Height:   Ht Readings from Last 1 Encounters:  09/12/18 5\' 5"  (1.651 m)    Weight:   Wt Readings from Last 1 Encounters:  09/12/18 41.9 kg    Ideal Body Weight:  (P) 56.8 kg  BMI:  Body mass index is 15.38 kg/m.  Estimated Nutritional Needs:   Kcal:  1500-1700  Protein:  70-80 grams  Fluid:  > 1.5 L/day  Kendell BaneHeather Tana Trefry RD, LDN, CNSC [phone] Pager  [phone] After Hours Pager  

## 2018-09-13 NOTE — Consult Note (Signed)
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 Referring Physician: Dr. Lynden Hughes  Mackenzie Hughes is an 38 y.o. female.  HPI:   I have reviewed her medical records at [facility] Everywhere. The patient is a 38 year old woman with a long history of intravenous drug abuse and hepatitis C infection who was apparently treated at [facility] in [place] in July 2019 for endocarditis.  An echocardiogram at that time showed a large echodensity on the posterior mitral leaflet consistent with vegetation as well as mitral regurgitation of unclear degree.  This was also complicated by cerebritis with diffuse bilateral lucencies seen on MRI of the brain suggesting possible septic emboli.  Patient was also diagnosed with Warnicke's encephalopathy and was treated.  She signed out AGAINST MEDICAL ADVICE and continue to use intravenous drugs and presented back to [facility] in early December with continued complaints related to her endocarditis.  She again signed out AGAINST MEDICAL ADVICE and presented back to [facility] shortly after that where she was found to be septic and diagnosed with calf myositis, splenic and renal infarcts, and further bilateral cerebral lesions.  She had a repeat 2D echocardiogram and transesophageal echocardiogram which showed a 1.5 x 2.0 cm vegetation on the posterior leaflet of the mitral valve with severe 4+ mitral regurgitation.  The echo report states that this was not much different than her previous echocardiogram back in July.  For some reason she signed out AGAINST MEDICAL ADVICE again from [facility] and presented here yesterday.  She has not had a repeat echocardiogram or any other diagnostic studies other than x-ray of her bilateral tibia and fibula since admission.  She says that she has continued to  use intravenous drugs and last injected heroin in her left arm about 3 days ago.  Past Medical History:  Diagnosis Date  . Endocarditis   . Hepatitis C infection   . IV drug abuse (HCC)   . Myositis   . Ovarian cancer in remission   . Wernicke encephalopathy     Past Surgical History:  Procedure Laterality Date  . TONSILLECTOMY    . TUBAL LIGATION      No family history on file.  Social History:  reports that she has been smoking cigarettes. She has been smoking about 0.50 packs per day. She has never used smokeless tobacco. She reports current drug use. Drugs: IV and Heroin. She reports that she does not drink alcohol.  Allergies:  Allergies  Allergen Reactions  . Toradol [Ketorolac Tromethamine] Other (See Comments)    GI upset  . Tramadol Other (See Comments)    GI upset    Medications:  I have reviewed the patient's current medications. Prior to Admission:  No medications prior to admission.   Scheduled: . buprenorphine-naloxone  2 tablet Sublingual QID  . famotidine  20 mg Oral Daily  . feeding supplement (ENSURE ENLIVE)  237 mL Oral TID BM  . heparin injection (subcutaneous)  5,000 Units Subcutaneous Q8H  . nicotine  14 mg Transdermal Daily  . sodium chloride flush  3 mL Intravenous Q12H   Continuous: . nafcillin IV 2 g (09/13/18 1640)  . thiamine injection 500 mg (09/13/18 1540)   NGE:[id number] **OR** acetaminophen, dicyclomine, hydrOXYzine, ketorolac, loperamide, methocarbamol,  MUSCLE RUB, ondansetron **OR** ondansetron (ZOFRAN) IV, traMADol-acetaminophen, traZODone Anti-infectives (From admission, onward)   Start     Dose/Rate Route Frequency Ordered Stop   09/13/18 2300  vancomycin (VANCOCIN) IVPB 750 mg/150 ml premix  Status:  Discontinued     750 mg 150 mL/hr over 60 Minutes Intravenous Every 12 hours 09/12/18 1140 09/12/18 1722   09/13/18 1000  nafcillin injection 2 g  Status:  Discontinued     2 g Intravenous Every 4 hours 09/13/18 0852  09/13/18 0926   09/13/18 1000  nafcillin injection 2 g  Status:  Discontinued     2 g Intramuscular Every 4 hours 09/13/18 0926 09/13/18 0949   09/13/18 1000  nafcillin 2 g in sodium chloride 0.9 % 100 mL IVPB     2 g 200 mL/hr over 30 Minutes Intravenous Every 4 hours 09/13/18 0949     09/13/18 0400  ceFAZolin (ANCEF) IVPB 2g/100 mL premix  Status:  Discontinued     2 g 200 mL/hr over 30 Minutes Intravenous Every 8 hours 09/12/18 2122 09/13/18 0852   09/12/18 1800  ceFAZolin (ANCEF) IVPB 2g/100 mL premix     2 g 200 mL/hr over 30 Minutes Intravenous Every 8 hours 09/12/18 1742 09/12/18 2050   09/12/18 1200  vancomycin (VANCOCIN) IVPB 1000 mg/200 mL premix     1,000 mg 200 mL/hr over 60 Minutes Intravenous  Once 09/12/18 1140 09/12/18 1700      Results for orders placed or performed during the hospital encounter of 09/12/18 (from the past 48 hour(s))  Blood culture (routine x 2)     Status: None (Preliminary result)   Collection Time: 09/12/18  2:45 AM  Result Value Ref Range   Specimen Description BLOOD RIGHT FOREARM    Special Requests      BOTTLES DRAWN AEROBIC AND ANAEROBIC Blood Culture results may not be optimal due to an inadequate volume of blood received in culture bottles   Culture      NO GROWTH 1 DAY Performed at [facility], [address] [place], [place] [address]    Report Status PENDING   Comprehensive metabolic panel     Status: Abnormal   Collection Time: 09/12/18  2:49 AM  Result Value Ref Range   Sodium 137 135 - 145 mmol/L   Potassium 2.9 (L) 3.5 - 5.1 mmol/L   Chloride 103 98 - 111 mmol/L   CO2 24 22 - 32 mmol/L   Glucose, Bld 97 70 - 99 mg/dL   BUN 15 6 - 20 mg/dL   Creatinine, Ser 1.910.54 0.44 - 1.00 mg/dL   Calcium 8.8 (L) 8.9 - 10.3 mg/dL   Total Protein 7.5 6.5 - 8.1 g/dL   Albumin 2.6 (L) 3.5 - 5.0 g/dL   AST 12 (L) 15 - 41 U/L   ALT 9 0 - 44 U/L   Alkaline Phosphatase 78 38 - 126 U/L   Total Bilirubin 0.3 0.3 - 1.2 mg/dL   GFR calc non  Af Amer >60 >60 mL/min   GFR calc Af Amer >60 >60 mL/min   Anion gap 10 5 - 15    Comment: Performed at [facility], [address] [place], [place] [address]  CBC with Differential     Status: Abnormal   Collection Time: 09/12/18  2:49 AM  Result Value Ref Range   WBC 12.0 (H) 4.0 - 10.5 K/uL   RBC 2.95 (L) 3.87 - 5.11 MIL/uL   Hemoglobin 8.2 (L)  12.0 - 15.0 g/dL   HCT 96.7 (L) 59.1 - 63.8 %   MCV 91.9 80.0 - 100.0 fL   MCH 27.8 26.0 - 34.0 pg   MCHC 30.3 30.0 - 36.0 g/dL   RDW 46.6 (H) 59.9 - 35.7 %   Platelets 374 150 - 400 K/uL   nRBC 0.0 0.0 - 0.2 %   Neutrophils Relative % 69 %   Neutro Abs 8.4 (H) 1.7 - 7.7 K/uL   Lymphocytes Relative 19 %   Lymphs Abs 2.2 0.7 - 4.0 K/uL   Monocytes Relative 9 %   Monocytes Absolute 1.1 (H) 0.1 - 1.0 K/uL   Eosinophils Relative 2 %   Eosinophils Absolute 0.2 0.0 - 0.5 K/uL   Basophils Relative 1 %   Basophils Absolute 0.1 0.0 - 0.1 K/uL   Immature Granulocytes 0 %   Abs Immature Granulocytes 0.05 0.00 - 0.07 K/uL    Comment: Performed at [facility], [address], [place], [place] [address]  Blood culture (routine x 2)     Status: None (Preliminary result)   Collection Time: 09/12/18  2:55 AM  Result Value Ref Range   Specimen Description BLOOD LEFT ARM    Special Requests      BOTTLES DRAWN AEROBIC ONLY Blood Culture results may not be optimal due to an excessive volume of blood received in culture bottles   Culture      NO GROWTH 1 DAY Performed at [facility], [address] [place], [place] [address]    Report Status PENDING   I-Stat CG4 Lactic Acid, ED     Status: None   Collection Time: 09/12/18  2:57 AM  Result Value Ref Range   Lactic Acid, Venous 1.33 0.5 - 1.9 mmol/L  Urinalysis, Routine w reflex microscopic     Status: Abnormal   Collection Time: 09/12/18  3:44 AM  Result Value Ref Range   Color, Urine AMBER (A) YELLOW    Comment: BIOCHEMICALS MAY BE AFFECTED BY COLOR   APPearance HAZY (A)  CLEAR   Specific Gravity, Urine 1.032 (H) 1.005 - 1.030   pH 5.0 5.0 - 8.0   Glucose, UA NEGATIVE NEGATIVE mg/dL   Hgb urine dipstick NEGATIVE NEGATIVE   Bilirubin Urine SMALL (A) NEGATIVE   Ketones, ur NEGATIVE NEGATIVE mg/dL   Protein, ur 092 (A) NEGATIVE mg/dL   Nitrite NEGATIVE NEGATIVE   Leukocytes, UA MODERATE (A) NEGATIVE   RBC / HPF 6-10 0 - 5 RBC/hpf   WBC, UA 21-50 0 - 5 WBC/hpf   Bacteria, UA RARE (A) NONE SEEN   Squamous Epithelial / LPF 6-10 0 - 5   Mucus PRESENT     Comment: Performed at [facility], [address], [place], [place] [address]  Urine rapid drug screen (hosp performed)     Status: Abnormal   Collection Time: 09/12/18  3:44 AM  Result Value Ref Range   Opiates POSITIVE (A) NONE DETECTED   Cocaine POSITIVE (A) NONE DETECTED   Benzodiazepines NONE DETECTED NONE DETECTED   Amphetamines POSITIVE (A) NONE DETECTED   Tetrahydrocannabinol NONE DETECTED NONE DETECTED   Barbiturates POSITIVE (A) NONE DETECTED    Comment: (NOTE) DRUG SCREEN FOR MEDICAL PURPOSES ONLY.  IF CONFIRMATION IS NEEDED FOR ANY PURPOSE, NOTIFY LAB WITHIN 5 DAYS. LOWEST DETECTABLE LIMITS FOR URINE DRUG SCREEN Drug Class  Cutoff (ng/mL) Amphetamine and metabolites    1000 Barbiturate and metabolites    200 Benzodiazepine                 200 Tricyclics and metabolites     300 Opiates and metabolites        300 Cocaine and metabolites        300 THC                            50 Performed at [facility], [address], [place], [place] [address]   Pregnancy, urine     Status: None   Collection Time: 09/12/18  3:44 AM  Result Value Ref Range   Preg Test, Ur NEGATIVE NEGATIVE    Comment:        THE SENSITIVITY OF THIS METHODOLOGY IS >20 mIU/mL. Performed at [facility], [address] [place], [place] [address]   RPR     Status: None   Collection Time: 09/12/18 11:15 AM  Result Value Ref Range   RPR Ser Ql Non Reactive Non Reactive     Comment: (NOTE) Performed At: [facility] [address] [place], [place] [id number] Jolene Schimke MD PY:[id number]   MRSA PCR Screening     Status: None   Collection Time: 09/12/18  4:01 PM  Result Value Ref Range   MRSA by PCR NEGATIVE NEGATIVE    Comment:        The GeneXpert MRSA Assay (FDA approved for NASAL specimens only), is one component of a comprehensive MRSA colonization surveillance program. It is not intended to diagnose MRSA infection nor to guide or monitor treatment for MRSA infections. Performed at [facility], [address], [place], [place] [address]   Potassium     Status: Abnormal   Collection Time: 09/13/18 12:04 AM  Result Value Ref Range   Potassium 3.4 (L) 3.5 - 5.1 mmol/L    Comment: Performed at [facility], [address], [place], [place] [address]  Magnesium     Status: None   Collection Time: 09/13/18 12:04 AM  Result Value Ref Range   Magnesium 1.8 1.7 - 2.4 mg/dL    Comment: Performed at [facility], [address] [place], [place] [address]  Basic metabolic panel     Status: Abnormal   Collection Time: 09/13/18  3:53 AM  Result Value Ref Range   Sodium 140 135 - 145 mmol/L   Potassium 3.5 3.5 - 5.1 mmol/L   Chloride 106 98 - 111 mmol/L   CO2 27 22 - 32 mmol/L   Glucose, Bld 97 70 - 99 mg/dL   BUN 16 6 - 20 mg/dL   Creatinine, Ser 4.09 0.44 - 1.00 mg/dL   Calcium 8.1 (L) 8.9 - 10.3 mg/dL   GFR calc non Af Amer >60 >60 mL/min   GFR calc Af Amer >60 >60 mL/min   Anion gap 7 5 - 15    Comment: Performed at [facility], [address] [place], [place] [address]  CBC     Status: Abnormal   Collection Time: 09/13/18  3:53 AM  Result Value Ref Range   WBC 9.5 4.0 - 10.5 K/uL   RBC 3.05 (L) 3.87 - 5.11 MIL/uL   Hemoglobin 8.0 (L) 12.0 - 15.0 g/dL   HCT 99.2 (L) 42.6 - 83.4 %   MCV 91.8 80.0 - 100.0  fL   MCH 26.2 26.0 - 34.0 pg   MCHC 28.6 (L) 30.0 - 36.0 g/dL   RDW 16.120.0 (H) 09.611.5 - 04.515.5 %    Platelets 364 150 - 400 K/uL   nRBC 0.0 0.0 - 0.2 %    Comment: Performed at [facility], [address] [place], [place] [id number]  Glucose, capillary     Status: None   Collection Time: 09/13/18  8:16 AM  Result Value Ref Range   Glucose-Capillary 90 70 - 99 mg/dL  Glucose, capillary     Status: Abnormal   Collection Time: 09/13/18 12:15 PM  Result Value Ref Range   Glucose-Capillary 126 (H) 70 - 99 mg/dL  Glucose, capillary     Status: Abnormal   Collection Time: 09/13/18  5:35 PM  Result Value Ref Range   Glucose-Capillary 121 (H) 70 - 99 mg/dL    Dg Tibia/fibula Left  Result Date: 09/12/2018 CLINICAL DATA:  Bilateral leg pain EXAM: LEFT TIBIA AND FIBULA - 2 VIEW COMPARISON:  None. FINDINGS: There is no evidence of fracture or other focal bone lesions. Soft tissues are unremarkable. No radiographic changes of osteomyelitis. IMPRESSION: Negative. Electronically Signed   By: Charlett NoseKevin  Dover M.D.   On: 09/12/2018 12:26   Dg Tibia/fibula Right  Result Date: 09/12/2018 CLINICAL DATA:  Bilateral leg pain EXAM: RIGHT TIBIA AND FIBULA - 2 VIEW COMPARISON:  None. FINDINGS: There is no evidence of fracture or other focal bone lesions. Soft tissues are unremarkable. No radiographic changes of osteomyelitis. IMPRESSION: Negative. Electronically Signed   By: Charlett NoseKevin  Dover M.D.   On: 09/12/2018 12:26    Review of Systems  Constitutional: Positive for malaise/fatigue and weight loss. Negative for chills and fever.  HENT: Negative.   Eyes: Negative.   Respiratory: Positive for shortness of breath. Negative for cough and hemoptysis.   Cardiovascular: Negative for chest pain, orthopnea and leg swelling.  Gastrointestinal: Negative.   Genitourinary: Negative.   Musculoskeletal: Positive for myalgias and neck pain.  Skin: Negative.   Neurological: Positive for headaches.  Endo/Heme/Allergies: Negative.   Psychiatric/Behavioral: Positive for substance abuse.   Blood pressure 96/64, pulse 72,  temperature 98 F (36.7 C), temperature source Oral, resp. rate 10, height 5\' 5"  (1.651 m), weight 41.9 kg, SpO2 99 %. Physical Exam  Constitutional: She is oriented to person, place, and time.  Cachectic woman in no distress  HENT:  Poor dentition  Eyes: Pupils are equal, round, and reactive to light. EOM are normal.  Neck: No JVD present.  Cardiovascular: Normal rate, regular rhythm and intact distal pulses.  Murmur heard. 3/6 systolic murmur at the apex  Respiratory: Effort normal and breath sounds normal. No respiratory distress. She has no rales.  GI: Soft. Bowel sounds are normal. She exhibits no distension. There is no abdominal tenderness.  Musculoskeletal:        General: No edema.  Lymphadenopathy:    She has no cervical adenopathy.  Neurological: She is alert and oriented to person, place, and time.  Skin: Skin is warm and dry.    Assessment/Plan:  This 38 year old active intravenous drug abuser presents with MSSA mitral valve endocarditis which probably started in July and is never been completely treated.  She was noted to have a large vegetation on the posterior mitral leaflet at that time and repeat echocardi[facility] the middle of [facility] showed no significant change in the size of the vegetation at 1.5 x 2 cm with severe mitral  regurgitation.  She has had evidence of embolic disease and this dates back to her initial presentation in July when an MRI of the brain already showed multiple diffuse lesions consistent with septic emboli.  She has signed out AMA on multiple occasions from other institutions and now presents here for treatment.  She has not had a repeat echocardiogram, CT scan of the brain, chest, or abdomen since the middle of December at [facility].  She is in generally poor condition with severe malnutrition and cachexia and is certainly not a candidate for mitral valve replacement at this time.  I would recommend continuing a full course of  intravenous antibiotics and abstention from drug abuse.  If she completed a full antibiotic course and remain drug-free but continued to have severe mitral regurgitation and developed heart failure symptoms then mitral valve replacement could be entertained.  She is still at risk for further embolic disease but I do not think she is a candidate for valve replacement at this time regardless of further emboli.  I discussed all this with her and she said that she did not have any further questions.  I spent 60 minutes performing this consultation and > 50% of this time was spent face to face counseling and coordinating the care of this patient's mitral valve endocarditis.  Alleen Borne 09/13/2018, 7:15 PM

## 2018-09-13 NOTE — Progress Notes (Signed)
[facility] Progress Note  Patient: Mackenzie HoldingCarla T Hughes QQV:[id number]   PCP: Patient, No Pcp Per DOB: Nov 17, 1980   DOA: 09/12/2018   DOS: 09/13/2018   Date of Service: the patient was seen and examined on 09/13/2018  Brief hospital course: Pt. with PMH of  IVDA; Hep C; remote ovarian CA; Wernicke's encephalopathy (7/19); and MRSA mitral valve endocarditis; admitted on 09/12/2018, presented with complaint of leg pain, was found to have ongoing MRSA endocarditis. [facility] and [facility]. [facility] again on 09/07/18 left AMA.  septic pulmonary emboli as well as cerebral abscesses and renal and splenic infarctions. UDS in outside hospital was positive for amphetamines and opiates. Currently further plan is continue IV antibiotics and get CT surgery evaluation.  Subjective: Severe pain in left leg.  Request to increase the frequency of Suboxone.  No nausea no vomiting.  No fever no chills.  Telemetry: Sinus tachycardia.  Assessment and Plan: IV drug abuse with heroin dependence -Long-standing heroin dependence -She has a pattern of leaving AMA and returning to the ER -initiate suboxone therapy now; while this may precipitate early withdrawal, hopefully she can reach a level of withdrawal symptom relief that will enable her to remain hospitalized and complete treatment -Suboxone protocol implemented -Will monitor on COWS protocol -prn orders from the Clonidine withdrawal order set were also ordered. -She again reports desire for long-term help and to quit using substances  Recent MRSA bacteremia with endocarditis -08/17/18 Echo with "large echodensity (1.5 x 2.0 cm) on the posterior mitral valve leaflet consistent with a vegetation. (not all that different than the previous echo in July)." with severe (4+) mitral regurgitation -Blood cultures were most recently positive as of 08/23/18 as well as 08/17/18  with Staph that was resistant to  Clinda and Erythromycin.  It was not MRSA at that time. -Blood cultures from 12/27 were negative. -Most recent documented MRSA cultures were from 03/12/18 and 03/16/18. -ID consult requested; on IV nafcillin. -Current blood cultures are negative for 24 hours.  Wernicke encephalopathy -Patient previously diagnosed with Wernicke's -She was treated with IV thiamine, although it is not clear that she completed her entire course  -Will initiate treatment with thiamine for now 500 mg IV TID x 3 days and then transition to 100 mg IV/PO daily  Myositis -Patient with concern for myositis or early myonecrosis of the left calf during her last admission -X-rays are negative for any acute abnormality.  Tobacco dependence -Encourage cessation.   -This was discussed with the patient and should be reviewed on an ongoing basis.   -Patch ordered at patient request.  Anemia of chronic disease. Monitor serial H&H.  Severe protein calorie malnutrition. Underweight Body mass index is 15.38 kg/m.  Nutrition Problem: Severe Malnutrition Etiology: chronic illness(endocarditis and IVDA) Interventions: Interventions: Ensure Enlive (each supplement provides 350kcal and 20 grams of protein), Snacks   Pressure ulcer  Pressure Injury 03/06/18 Stage II -  Partial thickness loss of dermis presenting as a shallow open ulcer with a red, pink wound bed without slough. scabbed (Active)  03/06/18 1630  Location: Coccyx  Location Orientation: Mid  Staging: Stage II -  Partial thickness loss of dermis presenting as a shallow open ulcer with a red, pink wound bed without slough.  Wound Description (Comments): scabbed  Present on Admission: Yes     Diet: regular diet DVT Prophylaxis: subcutaneous Heparin  Advance goals of care discussion: full code  Family Communication: no  family was present at bedside, at the time of interview.   Disposition:  Discharge to home.  Consultants: ID, CT surgery Procedures:  none  Scheduled Meds: . buprenorphine-naloxone  2 tablet Sublingual QID  . famotidine  20 mg Oral Daily  . feeding supplement (ENSURE ENLIVE)  237 mL Oral TID BM  . heparin injection (subcutaneous)  5,000 Units Subcutaneous Q8H  . nicotine  14 mg Transdermal Daily  . sodium chloride flush  3 mL Intravenous Q12H   Continuous Infusions: . nafcillin IV 2 g (09/13/18 1640)  . thiamine injection 500 mg (09/13/18 1540)   PRN Meds: acetaminophen **OR** acetaminophen, dicyclomine, hydrOXYzine, ketorolac, loperamide, methocarbamol, MUSCLE RUB, ondansetron **OR** ondansetron (ZOFRAN) IV, traMADol-acetaminophen, traZODone Antibiotics: Anti-infectives (From admission, onward)   Start     Dose/Rate Route Frequency Ordered Stop   09/13/18 2300  vancomycin (VANCOCIN) IVPB 750 mg/150 ml premix  Status:  Discontinued     750 mg 150 mL/hr over 60 Minutes Intravenous Every 12 hours 09/12/18 1140 09/12/18 1722   09/13/18 1000  nafcillin injection 2 g  Status:  Discontinued     2 g Intravenous Every 4 hours 09/13/18 0852 09/13/18 0926   09/13/18 1000  nafcillin injection 2 g  Status:  Discontinued     2 g Intramuscular Every 4 hours 09/13/18 0926 09/13/18 0949   09/13/18 1000  nafcillin 2 g in sodium chloride 0.9 % 100 mL IVPB     2 g 200 mL/hr over 30 Minutes Intravenous Every 4 hours 09/13/18 0949     09/13/18 0400  ceFAZolin (ANCEF) IVPB 2g/100 mL premix  Status:  Discontinued     2 g 200 mL/hr over 30 Minutes Intravenous Every 8 hours 09/12/18 2122 09/13/18 0852   09/12/18 1800  ceFAZolin (ANCEF) IVPB 2g/100 mL premix     2 g 200 mL/hr over 30 Minutes Intravenous Every 8 hours 09/12/18 1742 09/12/18 2050   09/12/18 1200  vancomycin (VANCOCIN) IVPB 1000 mg/200 mL premix     1,000 mg 200 mL/hr over 60 Minutes Intravenous  Once 09/12/18 1140 09/12/18 1700       Objective: Physical Exam: Vitals:   09/12/18 2346 09/13/18 0441 09/13/18 0814 09/13/18 1637  BP: (!) 87/55 91/65 93/64  96/64  Pulse:  69  63 72  Resp: 20   10  Temp: 97.9 F (36.6 C)  98 F (36.7 C) 98 F (36.7 C)  TempSrc: Oral  Oral Oral  SpO2: 98% 98% 99%   Weight:      Height:        Intake/Output Summary (Last 24 hours) at 09/13/2018 1737 Last data filed at 09/13/2018 1148 Gross per 24 hour  Intake 930 ml  Output -  Net 930 ml   Filed Weights   09/12/18 1543  Weight: 41.9 kg   General: Alert, Awake and Oriented to Time, Place and Person. Appear in mild distress, affect appropriate Eyes: PERRL, Conjunctiva normal ENT: Oral Mucosa clear moist. Neck: no JVD, no Abnormal Mass Or lumps Cardiovascular: S1 and S2 Present, aortic systolic  Murmur, Peripheral Pulses Present Respiratory: normal respiratory effort, Bilateral Air entry equal and Decreased, no use of accessory muscle, Clear to Auscultation, no Crackles, no wheezes Abdomen: Bowel Sound present, Soft and no tenderness, no hernia Skin: no redness, no Rash, no induration Extremities: no Pedal edema, bilateral  calf tenderness Neurologic: Grossly no focal neuro deficit. Bilaterally Equal motor strength  Data Reviewed: CBC: Recent Labs  Lab 09/12/18 0249 09/13/18 0353  WBC 12.0*  9.5  NEUTROABS 8.4*  --   HGB 8.2* 8.0*  HCT 27.1* 28.0*  MCV 91.9 91.8  PLT 374 364   Basic Metabolic Panel: Recent Labs  Lab 09/12/18 0249 09/13/18 0004 09/13/18 0353  NA 137  --  140  K 2.9* 3.4* 3.5  CL 103  --  106  CO2 24  --  27  GLUCOSE 97  --  97  BUN 15  --  16  CREATININE 0.54  --  0.60  CALCIUM 8.8*  --  8.1*  MG  --  1.8  --     Liver Function Tests: Recent Labs  Lab 09/12/18 0249  AST 12*  ALT 9  ALKPHOS 78  BILITOT 0.3  PROT 7.5  ALBUMIN 2.6*   No results for input(s): LIPASE, AMYLASE in the last 168 hours. No results for input(s): AMMONIA in the last 168 hours. Coagulation Profile: No results for input(s): INR, PROTIME in the last 168 hours. Cardiac Enzymes: No results for input(s): CKTOTAL, CKMB, CKMBINDEX, TROPONINI in the last  168 hours. BNP (last 3 results) No results for input(s): PROBNP in the last 8760 hours. CBG: Recent Labs  Lab 09/13/18 0816 09/13/18 1215  GLUCAP 90 126*   Studies: No results found.   Time spent: 35 minutes  Author: Lynden Oxford, MD [facility] Pager: [phone] 09/13/2018 5:37 PM  Between 7PM-7AM, please contact night-coverage at [email or web address], password [facility]

## 2018-09-13 NOTE — Progress Notes (Signed)
[facility]  Date of Admission:  09/12/2018     Total days of antibiotics 2         ASSESSMENT/PLAN  Ms. 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 Clinically she is currently stable with repeat cultures remaining without growth and no fevers or leukocytosis. Given the CNS involvement of her infection will change Cefazolin to nafcillin which has better CNS penetration. She is going to require at least 6 weeks of IV therapy and will likely need to complete it here in the hospital with her history of continued IVDU. She is agreeable to the plan at present. Opoid use disorder being treated with Suboxone and would like to go back on methadone if possible. With her continued left calf pain she may benefit from orthopedic evaluation.  1. Change cefazolin to nafcillin. 2. Continue to monitor cultures, WBC count and fevers.  3. Recommend CT surgery evaluation. 4. Once blood cultures are clear will consider PICC line placement.  5. MRI of the cervical spine if neck pain does not improve. 6. Continue treatment for opoid use disorder through primary team.   Principal Problem:   Bacteremia due to methicillin susceptible Staphylococcus aureus (MSSA) Active Problems:   Endocarditis of mitral valve   Myositis   Cerebritis   Tobacco user   Opiate dependence (HCC)   Wernicke encephalopathy   IV drug abuse (HCC)   . buprenorphine-naloxone  1 tablet Sublingual BID  . heparin injection (subcutaneous)  5,000 Units Subcutaneous Q8H  . nafcillin  2 g Intramuscular Q4H  . nicotine  14 mg Transdermal Daily  . sodium chloride flush  3 mL Intravenous Q12H    SUBJECTIVE:  Afebrile overnight with no leukocytosis.  X-ray imaging of bilateral tibia/fibula with no evidence of osteomyelitis.  No acute events  overnight.  Feeling better today. Having pain in the left calf with a severity that is effecting her ability to walk. She is also having pain on the right side of her neck described as sharp. Would like to have double portions of food if possible. Would like to discuss possibility of going back on methadone.   Allergies  Allergen Reactions  . Toradol [Ketorolac Tromethamine] Other (See Comments)    GI upset  . Tramadol Other (See Comments)    GI upset     Review of Systems: Review of Systems  Constitutional: Negative for chills and fever.  Respiratory: Negative for cough, sputum production, shortness of breath and wheezing.   Cardiovascular: Negative for chest pain and leg swelling.  Gastrointestinal: Negative for abdominal pain, diarrhea, nausea and vomiting.  Musculoskeletal: Positive for myalgias and neck pain.  Skin: Negative for rash.  Neurological: Positive for headaches. Negative for seizures, loss of consciousness and weakness.  Psychiatric/Behavioral: Negative for depression, substance abuse and suicidal ideas. The patient is not nervous/anxious.       OBJECTIVE: Vitals:   09/12/18 2016 09/12/18 2346 09/13/18 0441 09/13/18 0814  BP: 95/62 (!) 87/55 91/65 93/64   Pulse: 67 69  63  Resp: (!) 22 20    Temp: 98.6 F (37 C) 97.9 F (36.6 C)  98 F (36.7 C)  TempSrc: Oral Oral  Oral  SpO2: 100% 98% 98% 99%  Weight:      Height:       Body mass index is 15.38 kg/m.  Physical Exam Constitutional:  General: She is not in acute distress.    Appearance: She is cachectic. She is ill-appearing.  Cardiovascular:     Rate and Rhythm: Normal rate.     Heart sounds: Murmur present. Systolic murmur present.  Pulmonary:     Effort: Pulmonary effort is normal. No respiratory distress.     Breath sounds: Normal breath sounds. No wheezing, rhonchi or rales.  Abdominal:     General: Bowel sounds are normal. There is no distension.     Tenderness: There is no abdominal  tenderness. There is no guarding.     Hernia: A hernia is present.  Skin:    General: Skin is warm and dry.  Neurological:     Mental Status: She is alert and oriented to person, place, and time.  Psychiatric:        Behavior: Behavior normal. Behavior is cooperative.        Thought Content: Thought content normal.        Judgment: Judgment normal.     Lab Results Lab Results  Component Value Date   WBC 9.5 09/13/2018   HGB 8.0 (L) 09/13/2018   HCT 28.0 (L) 09/13/2018   MCV 91.8 09/13/2018   PLT 364 09/13/2018    Lab Results  Component Value Date   CREATININE 0.60 09/13/2018   BUN 16 09/13/2018   NA 140 09/13/2018   K 3.5 09/13/2018   CL 106 09/13/2018   CO2 27 09/13/2018    Lab Results  Component Value Date   ALT 9 09/12/2018   AST 12 (L) 09/12/2018   ALKPHOS 78 09/12/2018   BILITOT 0.3 09/12/2018     Microbiology: Recent Results (from the past 240 hour(s))  Blood culture (routine x 2)     Status: None (Preliminary result)   Collection Time: 09/12/18  2:45 AM  Result Value Ref Range Status   Specimen Description BLOOD RIGHT FOREARM  Final   Special Requests   Final    BOTTLES DRAWN AEROBIC AND ANAEROBIC Blood Culture results may not be optimal due to an inadequate volume of blood received in culture bottles   Culture   Final    NO GROWTH 1 DAY Performed at [facility], [address], [place], [place] [address]    Report Status PENDING  Incomplete  Blood culture (routine x 2)     Status: None (Preliminary result)   Collection Time: 09/12/18  2:55 AM  Result Value Ref Range Status   Specimen Description BLOOD LEFT ARM  Final   Special Requests   Final    BOTTLES DRAWN AEROBIC ONLY Blood Culture results may not be optimal due to an excessive volume of blood received in culture bottles   Culture   Final    NO GROWTH 1 DAY Performed at [facility], [address], [place], [place] [address]    Report Status PENDING  Incomplete  MRSA PCR  Screening     Status: None   Collection Time: 09/12/18  4:01 PM  Result Value Ref Range Status   MRSA by PCR NEGATIVE NEGATIVE Final    Comment:        The GeneXpert MRSA Assay (FDA approved for NASAL specimens only), is one component of a comprehensive MRSA colonization surveillance program. It is not intended to diagnose MRSA infection nor to guide or monitor treatment for MRSA infections. Performed at [facility], [address] [place], [place] [address]      Marcos Eke,  NP [facility] [phone] Pager  09/13/2018  9:46 AM

## 2018-09-14 ENCOUNTER — Inpatient Hospital Stay (HOSPITAL_COMMUNITY): Payer: Medicaid Other

## 2018-09-14 DIAGNOSIS — M7989 Other specified soft tissue disorders: Secondary | ICD-10-CM

## 2018-09-14 DIAGNOSIS — R64 Cachexia: Secondary | ICD-10-CM

## 2018-09-14 DIAGNOSIS — Z681 Body mass index (BMI) 19 or less, adult: Secondary | ICD-10-CM

## 2018-09-14 DIAGNOSIS — M79609 Pain in unspecified limb: Secondary | ICD-10-CM

## 2018-09-14 DIAGNOSIS — R51 Headache: Secondary | ICD-10-CM

## 2018-09-14 LAB — GLUCOSE, CAPILLARY
Glucose-Capillary: 89 mg/dL (ref 70–99)
Glucose-Capillary: 84 mg/dL (ref 70–99)
Glucose-Capillary: 110 mg/dL — ABNORMAL HIGH (ref 70–99)
Glucose-Capillary: 95 mg/dL (ref 70–99)

## 2018-09-14 LAB — BASIC METABOLIC PANEL
Anion gap: 5 (ref 5–15)
BUN: 18 mg/dL (ref 6–20)
CO2: 28 mmol/L (ref 22–32)
Calcium: 8.1 mg/dL — ABNORMAL LOW (ref 8.9–10.3)
Chloride: 107 mmol/L (ref 98–111)
Creatinine, Ser: 0.58 mg/dL (ref 0.44–1.00)
GFR calc Af Amer: >60 mL/min (ref >60)
GFR calc non Af Amer: >60 mL/min (ref >60)
Glucose, Bld: 111 mg/dL — ABNORMAL HIGH (ref 70–99)
Potassium: 3.7 mmol/L (ref 3.5–5.1)
Sodium: 140 mmol/L (ref 135–145)

## 2018-09-14 LAB — CBC
HCT: 28 % — ABNORMAL LOW (ref 36.0–46.0)
Hemoglobin: 8.1 g/dL — ABNORMAL LOW (ref 12.0–15.0)
MCH: 26.7 pg (ref 26.0–34.0)
MCHC: 28.9 g/dL — ABNORMAL LOW (ref 30.0–36.0)
MCV: 92.4 fL (ref 80.0–100.0)
Platelets: 327 10*3/uL (ref 150–400)
RBC: 3.03 MIL/uL — ABNORMAL LOW (ref 3.87–5.11)
RDW: 20 % — ABNORMAL HIGH (ref 11.5–15.5)
WBC: 6.8 10*3/uL (ref 4.0–10.5)
nRBC: 0 % (ref 0.0–0.2)

## 2018-09-14 MED ORDER — VITAMIN B-1 100 MG PO TABS
100.0000 mg | ORAL_TABLET | Freq: Every day | ORAL | Status: DC
  Administered 2018-09-16 – 2018-09-21 (×6): 100 mg via ORAL
  Filled 2018-09-14 (×7): qty 1

## 2018-09-14 MED ORDER — SODIUM CHLORIDE 0.9% FLUSH: 10.0000 mL | INTRAVENOUS | Status: DC | PRN

## 2018-09-14 NOTE — Progress Notes (Signed)
[facility]  Date of Admission:  09/12/2018     Total days of antibiotics 3         ASSESSMENT/PLAN  Mackenzie Hughes has disseminated MSSA infection complicated with cerebritis, left calf myositis, splenic and renal infarcts, and mitral valve endocarditis. Not presently a surgical candidate with potential in the future. Repeat cultures have remained without growth. Now with midline due to difficult access. Appears to be tolerating her Nafcillin with no adverse side effects. Again, disposition is to remain in the hospital for the duration of treatment. Hepatitis C with no viral load and no treatment is needed.   1. Continue nafcillin. 2. Monitor cultures, WBC count and fevers.  3. Opoid use disorder per primary team.    Principal Problem:   Bacteremia due to methicillin susceptible Staphylococcus aureus (MSSA) Active Problems:   Endocarditis of mitral valve   Myositis   Cerebritis   Tobacco user   Opiate dependence (HCC)   IV drug abuse (HCC)   Protein-calorie malnutrition, severe   . buprenorphine-naloxone  2 tablet Sublingual QID  . famotidine  20 mg Oral Daily  . feeding supplement (ENSURE ENLIVE)  237 mL Oral TID BM  . heparin injection (subcutaneous)  5,000 Units Subcutaneous Q8H  . nicotine  14 mg Transdermal Daily  . sodium chloride flush  3 mL Intravenous Q12H  . [START ON 09/16/2018] thiamine  100 mg Oral Daily    SUBJECTIVE:  Afebrile overnight with no acute events overnight. Resting in bed and continues to have left calf pain.   Allergies  Allergen Reactions  . Toradol [Ketorolac Tromethamine] Other (See Comments)    GI upset  . Tramadol Other (See Comments)    GI upset     Review of Systems: Review of Systems  Constitutional: Negative for chills and fever.  Respiratory: Negative for cough, shortness of breath and wheezing.   Cardiovascular: Negative for chest pain and leg swelling.  Gastrointestinal: Negative for abdominal  pain, constipation, diarrhea, nausea and vomiting.  Musculoskeletal: Positive for myalgias.  Skin: Negative for rash.  Neurological: Positive for headaches.    OBJECTIVE: Vitals:   09/13/18 1637 09/13/18 2305 09/14/18 0433 09/14/18 0831  BP: 96/64 94/72  98/69  Pulse: 72 66  66  Resp: 10 20  15   Temp: 98 F (36.7 C) 98.4 F (36.9 C)    TempSrc: Oral     SpO2:  98%  100%  Weight:   46.3 kg   Height:       Body mass index is 16.99 kg/m.  Physical Exam Constitutional:      General: She is not in acute distress.    Appearance: She is cachectic. She is ill-appearing.  Cardiovascular:     Rate and Rhythm: Normal rate and regular rhythm.     Heart sounds: Murmur present. Systolic murmur present.  Pulmonary:     Effort: Pulmonary effort is normal.     Breath sounds: Normal breath sounds.  Skin:    General: Skin is warm and dry.  Neurological:     Mental Status: She is alert and oriented to person, place, and time.  Psychiatric:        Behavior: Behavior normal. Behavior is cooperative.        Thought Content: Thought content normal.        Judgment: Judgment normal.     Lab Results Lab Results  Component Value Date   WBC 6.8 09/14/2018   HGB 8.1 (L)  09/14/2018   HCT 28.0 (L) 09/14/2018   MCV 92.4 09/14/2018   PLT 327 09/14/2018    Lab Results  Component Value Date   CREATININE 0.58 09/14/2018   BUN 18 09/14/2018   NA 140 09/14/2018   K 3.7 09/14/2018   CL 107 09/14/2018   CO2 28 09/14/2018    Lab Results  Component Value Date   ALT 9 09/12/2018   AST 12 (L) 09/12/2018   ALKPHOS 78 09/12/2018   BILITOT 0.3 09/12/2018     Microbiology: Recent Results (from the past 240 hour(s))  Blood culture (routine x 2)     Status: None (Preliminary result)   Collection Time: 09/12/18  2:45 AM  Result Value Ref Range Status   Specimen Description BLOOD RIGHT FOREARM  Final   Special Requests   Final    BOTTLES DRAWN AEROBIC AND ANAEROBIC Blood Culture results may  not be optimal due to an inadequate volume of blood received in culture bottles   Culture   Final    NO GROWTH 2 DAYS Performed at [facility], [address] [place], [place] [address]    Report Status PENDING  Incomplete  Blood culture (routine x 2)     Status: None (Preliminary result)   Collection Time: 09/12/18  2:55 AM  Result Value Ref Range Status   Specimen Description BLOOD LEFT ARM  Final   Special Requests   Final    BOTTLES DRAWN AEROBIC ONLY Blood Culture results may not be optimal due to an excessive volume of blood received in culture bottles   Culture   Final    NO GROWTH 2 DAYS Performed at [facility], [address], [place], [place] [address]    Report Status PENDING  Incomplete  MRSA PCR Screening     Status: None   Collection Time: 09/12/18  4:01 PM  Result Value Ref Range Status   MRSA by PCR NEGATIVE NEGATIVE Final    Comment:        The GeneXpert MRSA Assay (FDA approved for NASAL specimens only), is one component of a comprehensive MRSA colonization surveillance program. It is not intended to diagnose MRSA infection nor to guide or monitor treatment for MRSA infections. Performed at [facility], [address] [place], [place] [address]      Marcos Eke, NP [facility] [phone] Pager  09/14/2018  11:31 AM

## 2018-09-14 NOTE — Care Management Note (Signed)
Case Management Note  Patient Details  Name: SABREA HALLUM MRN: [id number] Date of Birth: [birth year]  Subjective/Objective:                 IVDU, severe endocarditis, MV regurge   Action/Plan:  Patient w endocarditis, will need IV Abx for 6 weeks, will need to remain inpatient. Has tendency to leave AMA prior to abx course completion.   Expected Discharge Date:  09/16/18               Expected Discharge Plan:     In-House Referral:  Clinical Social Work  Discharge planning Services  CM Consult  Post Acute Care Choice:    Choice offered to:     DME Arranged:    DME Agency:     HH Arranged:    HH Agency:     Status of Service:  In process, will continue to follow  If discussed at Long Length of Stay Meetings, dates discussed:    Additional Comments:  Lawerance Sabal, RN 09/14/2018, 12:35 PM

## 2018-09-14 NOTE — Progress Notes (Signed)
LE venous duplex       has been completed. Preliminary results can be found under CV proc through chart review. Drea Jurewicz, BS, RDMS, RVT   

## 2018-09-14 NOTE — Progress Notes (Signed)
[facility] Progress Note  Patient: Mackenzie HoldingCarla T Porzio ZOX:[id number]   PCP: Patient, No Pcp Per DOB: [birth year]   DOA: 09/12/2018   DOS: 09/14/2018   Date of Service: the patient was seen and examined on 09/14/2018  Brief hospital course: Pt. with PMH of  IVDA; Hep C; remote ovarian CA; Wernicke's encephalopathy (7/19); and MRSA mitral valve endocarditis; admitted on 09/12/2018, presented with complaint of leg pain, was found to have ongoing MRSA endocarditis. [facility]. [facility] again on 09/07/18 left AMA.  septic pulmonary emboli as well as cerebral abscesses and renal and splenic infarctions. UDS in outside hospital was positive for amphetamines and opiates. -Stable on IV nafcillin  Subjective: Continues to complain of pain in her calfs predominantly left  Assessment and Plan:  Recent MRSA bacteremia with endocarditis -08/17/18 Echo outside hospital with "large echodensity (1.5 x 2.0 cm) on the posterior mitral valve leaflet consistent with a vegetation with severe (4+) mitral regurgitation -Blood cultures were most recently positive as of 08/23/18 as well as 08/17/18  with MSSA resistant to Clinda and Erythromycin, previously in July 2019 was MRSA -Blood cultures now from 12/27 were negative. -Evaluated by [facility] Dr. Laneta SimmersBartle, not felt to be a candidate for valve replacement at this time would completely agree with this assessment, he recommended follow-up after she completed antibiotic course and remained drug-free if she develops symptoms from heart failure -Continue IV Nacillin for 6weeks per ID recs  IV drug abuse with heroin dependence -Long-standing heroin dependence, has left AMA 3 times since July at outside hospitals -Started on Suboxone, per protocol -Also on as needed clonidine  Wernicke encephalopathy -Patient previously diagnosed with Wernicke's -She was treated with IV thiamine, although it is not  clear that she completed her entire course  -Given high-dose thiamine for 3 days, transition to oral  Myositis -Patient with concern for myositis or early myonecrosis of the left calf during her last admission  Left calf pain and swelling -Could have been related to recent myositis -Also check venous duplex to rule out DVT  Tobacco dependence -Encourage cessation.   -This was discussed with the patient and should be reviewed on an ongoing basis.   -Patch ordered at patient request.  Anemia of chronic disease. Monitor serial H&H.  Severe protein calorie malnutrition. Underweight Body mass index is 16.99 kg/m.  Nutrition Problem: Severe Malnutrition Etiology: chronic illness(endocarditis and IVDA) Interventions: Interventions: Ensure Enlive (each supplement provides 350kcal and 20 grams of protein), Snacks   Pressure ulcer  Pressure Injury 03/06/18 Stage II -  Partial thickness loss of dermis presenting as a shallow open ulcer with a red, pink wound bed without slough. scabbed (Active)  03/06/18 1630  Location: Coccyx  Location Orientation: Mid  Staging: Stage II -  Partial thickness loss of dermis presenting as a shallow open ulcer with a red, pink wound bed without slough.  Wound Description (Comments): scabbed  Present on Admission: Yes     Diet: regular diet DVT Prophylaxis: subcutaneous Heparin  Advance goals of care discussion: full code  Family Communication: no family was present at bedside, at the time of interview.   Disposition: Home pending completion of IV antibiotics  Consultants: ID, CT surgery Procedures: none  Scheduled Meds: . buprenorphine-naloxone  2 tablet Sublingual QID  . famotidine  20 mg Oral Daily  . feeding supplement (ENSURE ENLIVE)  237 mL Oral TID BM  . heparin injection (subcutaneous)  5,000 Units Subcutaneous Q8H  . nicotine  14 mg Transdermal Daily  . sodium chloride flush  3 mL Intravenous Q12H  . [START ON 09/16/2018] thiamine   100 mg Oral Daily   Continuous Infusions: . nafcillin IV 2 g (09/14/18 1204)  . thiamine injection 500 mg (09/14/18 0943)   PRN Meds: acetaminophen **OR** acetaminophen, dicyclomine, hydrOXYzine, ketorolac, loperamide, methocarbamol, MUSCLE RUB, ondansetron **OR** ondansetron (ZOFRAN) IV, sodium chloride flush, traMADol-acetaminophen, traZODone Antibiotics: Anti-infectives (From admission, onward)   Start     Dose/Rate Route Frequency Ordered Stop   09/13/18 2300  vancomycin (VANCOCIN) IVPB 750 mg/150 ml premix  Status:  Discontinued     750 mg 150 mL/hr over 60 Minutes Intravenous Every 12 hours 09/12/18 1140 09/12/18 1722   09/13/18 1000  nafcillin injection 2 g  Status:  Discontinued     2 g Intravenous Every 4 hours 09/13/18 0852 09/13/18 0926   09/13/18 1000  nafcillin injection 2 g  Status:  Discontinued     2 g Intramuscular Every 4 hours 09/13/18 0926 09/13/18 0949   09/13/18 1000  nafcillin 2 g in sodium chloride 0.9 % 100 mL IVPB     2 g 200 mL/hr over 30 Minutes Intravenous Every 4 hours 09/13/18 0949     09/13/18 0400  ceFAZolin (ANCEF) IVPB 2g/100 mL premix  Status:  Discontinued     2 g 200 mL/hr over 30 Minutes Intravenous Every 8 hours 09/12/18 2122 09/13/18 0852   09/12/18 1800  ceFAZolin (ANCEF) IVPB 2g/100 mL premix     2 g 200 mL/hr over 30 Minutes Intravenous Every 8 hours 09/12/18 1742 09/12/18 2050   09/12/18 1200  vancomycin (VANCOCIN) IVPB 1000 mg/200 mL premix     1,000 mg 200 mL/hr over 60 Minutes Intravenous  Once 09/12/18 1140 09/12/18 1700       Objective: Physical Exam: Vitals:   09/13/18 1637 09/13/18 2305 09/14/18 0433 09/14/18 0831  BP: 96/64 94/72  98/69  Pulse: 72 66  66  Resp: 10 20  15   Temp: 98 F (36.7 C) 98.4 F (36.9 C)    TempSrc: Oral     SpO2:  98%  100%  Weight:   46.3 kg   Height:        Intake/Output Summary (Last 24 hours) at 09/14/2018 1333 Last data filed at 09/14/2018 0600 Gross per 24 hour  Intake 1291 ml  Output -    Net 1291 ml   Filed Weights   09/12/18 1543 09/14/18 0433  Weight: 41.9 kg 46.3 kg   Gen: Awake, Alert, Oriented X 3, frail cachectic, sitting up in bed, no distress HEENT: PERRLA, Neck supple, no JVD Lungs: Poor air movement, otherwise clear CVS: RRR,No Gallops,Rubs or new Murmurs Abd: soft, Non tender, non distended, BS present Extremities: Trace edema, mild tenderness in left calf Skin: no new rashes  Data Reviewed: CBC: Recent Labs  Lab 09/12/18 0249 09/13/18 0353 09/14/18 0327  WBC 12.0* 9.5 6.8  NEUTROABS 8.4*  --   --   HGB 8.2* 8.0* 8.1*  HCT 27.1* 28.0* 28.0*  MCV 91.9 91.8 92.4  PLT 374 364 327   Basic Metabolic Panel: Recent Labs  Lab 09/12/18 0249 09/13/18 0004 09/13/18 0353 09/14/18 0327  NA 137  --  140 140  K 2.9* 3.4* 3.5 3.7  CL 103  --  106 107  CO2 24  --  27 28  GLUCOSE 97  --  97 111*  BUN 15  --  16 18  CREATININE 0.54  --  0.60 0.58  CALCIUM 8.8*  --  8.1* 8.1*  MG  --  1.8  --   --     Liver Function Tests: Recent Labs  Lab 09/12/18 0249  AST 12*  ALT 9  ALKPHOS 78  BILITOT 0.3  PROT 7.5  ALBUMIN 2.6*   No results for input(s): LIPASE, AMYLASE in the last 168 hours. No results for input(s): AMMONIA in the last 168 hours. Coagulation Profile: No results for input(s): INR, PROTIME in the last 168 hours. Cardiac Enzymes: No results for input(s): CKTOTAL, CKMB, CKMBINDEX, TROPONINI in the last 168 hours. BNP (last 3 results) No results for input(s): PROBNP in the last 8760 hours. CBG: Recent Labs  Lab 09/13/18 1215 09/13/18 1735 09/13/18 2108 09/14/18 0830 09/14/18 1156  GLUCAP 126* 121* 140* 89 84   Studies: No results found.   Time spent: 25 minutes  Author: Zannie Cove, MD

## 2018-09-15 ENCOUNTER — Inpatient Hospital Stay: Payer: Self-pay

## 2018-09-15 DIAGNOSIS — K0889 Other specified disorders of teeth and supporting structures: Secondary | ICD-10-CM

## 2018-09-15 LAB — GLUCOSE, CAPILLARY
GLUCOSE-CAPILLARY: 94 mg/dL (ref 70–99)
Glucose-Capillary: 84 mg/dL (ref 70–99)
Glucose-Capillary: 101 mg/dL — ABNORMAL HIGH (ref 70–99)
Glucose-Capillary: 108 mg/dL — ABNORMAL HIGH (ref 70–99)

## 2018-09-15 NOTE — Progress Notes (Addendum)
[facility]  Date of Admission:  09/12/2018     Total days of antibiotics 4         ASSESSMENT/PLAN  Ms. Ellwood continues to receive treatment for MSSA infection complicated with cerebritis, left calf myositis, splenic and renal infarcts, mitral valve endocarditis and opoid/substance use disorder. Repeat cultures remain without growth and is tolerating the nafcillin with no adverse side effects. Goal will be to complete 6 weeks of IV therapy with nafcillin while remaining here in the hospital. Will need PICC line as the only IV access at present is peripheral.  1. Continue nafcillin through 10/25/18 . 2. Weekly CMET and CBC for therapeutic drug monitoring while on Nafcillin. No additional blood work is needed for monitoring.  3. Recommend oral surgery evaluation.  4. PICC line placement for prolonged IV therapy.  5. Follow up in the ID office at the completion of therapy.   ID will be available as needed during the remainder of her hospitalization.    Principal Problem:   Bacteremia due to methicillin susceptible Staphylococcus aureus (MSSA) Active Problems:   Endocarditis of mitral valve   Myositis   Cerebritis   Tobacco user   Opiate dependence (HCC)   IV drug abuse (HCC)   Protein-calorie malnutrition, severe   . buprenorphine-naloxone  2 tablet Sublingual QID  . famotidine  20 mg Oral Daily  . feeding supplement (ENSURE ENLIVE)  237 mL Oral TID BM  . heparin injection (subcutaneous)  5,000 Units Subcutaneous Q8H  . nicotine  14 mg Transdermal Daily  . sodium chloride flush  3 mL Intravenous Q12H  . [START ON 09/16/2018] thiamine  100 mg Oral Daily    SUBJECTIVE:  Afebrile with no acute events overnight. LE dopplers with no evidence of DVT.  Doing better. Appetite is good. Continues to have left calf pain that is exacerbated when walking and sometimes feels like it is in her knee.   Allergies  Allergen Reactions  . Toradol [Ketorolac  Tromethamine] Other (See Comments)    GI upset  . Tramadol Other (See Comments)    GI upset     Review of Systems: Review of Systems  Constitutional: Negative for chills, fever and weight loss.  Respiratory: Negative for cough, shortness of breath and wheezing.   Cardiovascular: Negative for chest pain and leg swelling.  Gastrointestinal: Negative for abdominal pain, constipation, diarrhea, nausea and vomiting.  Skin: Negative for rash.      OBJECTIVE: Vitals:   09/14/18 1644 09/14/18 1651 09/14/18 2344 09/15/18 0802  BP: (!) 83/54 108/71 (!) 92/59 91/60  Pulse: 69 70 72 68  Resp: 16 16 15 18   Temp:  (!) 97.5 F (36.4 C) 98.4 F (36.9 C) 97.7 F (36.5 C)  TempSrc:   Oral   SpO2: 99% 95%  100%  Weight:      Height:       Body mass index is 16.99 kg/m.  Physical Exam Constitutional:      General: She is not in acute distress.    Appearance: She is underweight. She is ill-appearing.  Cardiovascular:     Rate and Rhythm: Normal rate and regular rhythm.     Heart sounds: Murmur present. Systolic murmur present.  Pulmonary:     Effort: Pulmonary effort is normal.     Breath sounds: Normal breath sounds.  Skin:    General: Skin is warm and dry.  Neurological:     Mental Status: She is alert and oriented  to person, place, and time.  Psychiatric:        Behavior: Behavior normal. Behavior is cooperative.        Thought Content: Thought content normal.        Judgment: Judgment normal.     Lab Results Lab Results  Component Value Date   WBC 6.8 09/14/2018   HGB 8.1 (L) 09/14/2018   HCT 28.0 (L) 09/14/2018   MCV 92.4 09/14/2018   PLT 327 09/14/2018    Lab Results  Component Value Date   CREATININE 0.58 09/14/2018   BUN 18 09/14/2018   NA 140 09/14/2018   K 3.7 09/14/2018   CL 107 09/14/2018   CO2 28 09/14/2018    Lab Results  Component Value Date   ALT 9 09/12/2018   AST 12 (L) 09/12/2018   ALKPHOS 78 09/12/2018   BILITOT 0.3 09/12/2018      Microbiology: Recent Results (from the past 240 hour(s))  Blood culture (routine x 2)     Status: None (Preliminary result)   Collection Time: 09/12/18  2:45 AM  Result Value Ref Range Status   Specimen Description BLOOD RIGHT FOREARM  Final   Special Requests   Final    BOTTLES DRAWN AEROBIC AND ANAEROBIC Blood Culture results may not be optimal due to an inadequate volume of blood received in culture bottles   Culture   Final    NO GROWTH 3 DAYS Performed at [facility], [address], [place], [place] [address]    Report Status PENDING  Incomplete  Blood culture (routine x 2)     Status: None (Preliminary result)   Collection Time: 09/12/18  2:55 AM  Result Value Ref Range Status   Specimen Description BLOOD LEFT ARM  Final   Special Requests   Final    BOTTLES DRAWN AEROBIC ONLY Blood Culture results may not be optimal due to an excessive volume of blood received in culture bottles   Culture   Final    NO GROWTH 3 DAYS Performed at [facility], [address], [place], [place] [address]    Report Status PENDING  Incomplete  MRSA PCR Screening     Status: None   Collection Time: 09/12/18  4:01 PM  Result Value Ref Range Status   MRSA by PCR NEGATIVE NEGATIVE Final    Comment:        The GeneXpert MRSA Assay (FDA approved for NASAL specimens only), is one component of a comprehensive MRSA colonization surveillance program. It is not intended to diagnose MRSA infection nor to guide or monitor treatment for MRSA infections. Performed at [facility], [address], [place], [place] [address]      Marcos Eke, NP [facility] [phone] Pager  09/15/2018  9:15 AM

## 2018-09-15 NOTE — Progress Notes (Signed)
[facility] Progress Note  Patient: Mackenzie Hughes:[id number]   PCP: Patient, No Pcp Per DOB: 1980-11-09   DOA: 09/12/2018   DOS: 09/15/2018   Date of Service: the patient was seen and examined on 09/15/2018  Brief hospital course: Pt. with PMH of  IVDA; Hep C; remote ovarian CA; Wernicke's encephalopathy (7/19); and MRSA mitral valve endocarditis; admitted on 09/12/2018, presented with complaint of leg pain, was found to have ongoing MRSA endocarditis. admitted at [facility] and left [facility] 12/10; was admitted at [facility] 12/11 and left [facility] 12/20. [facility] again on 09/07/18 left AMA.  septic pulmonary emboli as well as cerebral abscesses and renal and splenic infarctions. UDS in outside hospital was positive for amphetamines and opiates. -Stable on IV nafcillin  Subjective: Continues to complain of pain in her calfs predominantly left  Assessment and Plan:  Recent MRSA bacteremia with endocarditis -08/17/18 Echo outside hospital with "large echodensity (1.5 x 2.0 cm) on the posterior mitral valve leaflet consistent with a vegetation with severe (4+) mitral regurgitation -Blood cultures were most recently positive as of 08/23/18 as well as 08/17/18  with MSSA resistant to Clinda and Erythromycin, previously in July 2019 was MRSA -Blood cultures now from 12/27 were negative. -Evaluated by [facility] Dr. Laneta Simmers, not felt to be a candidate for valve replacement at this time would completely agree with this assessment, he recommended follow-up after she completed antibiotic course and remained drug-free if she develops symptoms from heart failure -Continue IV Nacillin for 6weeks per ID recs -Remains stable  IV drug abuse with heroin dependence -Long-standing heroin dependence, has left AMA 3 times since July at outside hospitals -Started on Suboxone, per protocol and PRN clonidine  Wernicke encephalopathy -Patient previously diagnosed with Wernicke's -She was treated with IV thiamine, although it is  not clear that she completed her entire course  -Given high-dose thiamine for 3 days, transitioned to oral  Myositis -Patient with concern for myositis or early myonecrosis of the left calf during her last admission  Left calf pain and swelling -Could have been related to recent myositis -Improving, Dopplers negative for DVT  Tobacco dependence -Encourage cessation.   -This was discussed with the patient and should be reviewed on an ongoing basis.   -Patch ordered at patient request.  Anemia of chronic disease. Monitor serial H&H.  Severe protein calorie malnutrition. Underweight Body mass index is 16.99 kg/m.  Nutrition Problem: Severe Malnutrition Etiology: chronic illness(endocarditis and IVDA) Interventions: Interventions: Ensure Enlive (each supplement provides 350kcal and 20 grams of protein), Snacks   Pressure ulcer  Pressure Injury 03/06/18 Stage II -  Partial thickness loss of dermis presenting as a shallow open ulcer with a red, pink wound bed without slough. scabbed (Active)  03/06/18 1630  Location: Coccyx  Location Orientation: Mid  Staging: Stage II -  Partial thickness loss of dermis presenting as a shallow open ulcer with a red, pink wound bed without slough.  Wound Description (Comments): scabbed  Present on Admission: Yes     Diet: regular diet DVT Prophylaxis: subcutaneous Heparin  Advance goals of care discussion: full code  Family Communication: no family was present at bedside, at the time of interview.   Disposition: Home pending completion of IV antibiotics  Consultants: ID, CT surgery Procedures: none  Scheduled Meds: . buprenorphine-naloxone  2 tablet Sublingual QID  . famotidine  20 mg Oral Daily  . feeding supplement (ENSURE ENLIVE)  237 mL Oral TID BM  . heparin injection (subcutaneous)  5,000 Units Subcutaneous  Q8H  . nicotine  14 mg Transdermal Daily  . sodium chloride flush  3 mL Intravenous Q12H  . [START ON 09/16/2018] thiamine   100 mg Oral Daily   Continuous Infusions: . nafcillin IV 2 g (09/15/18 1314)   PRN Meds: acetaminophen **OR** acetaminophen, dicyclomine, hydrOXYzine, ketorolac, loperamide, methocarbamol, MUSCLE RUB, ondansetron **OR** ondansetron (ZOFRAN) IV, sodium chloride flush, traMADol-acetaminophen, traZODone Antibiotics: Anti-infectives (From admission, onward)   Start     Dose/Rate Route Frequency Ordered Stop   09/13/18 2300  vancomycin (VANCOCIN) IVPB 750 mg/150 ml premix  Status:  Discontinued     750 mg 150 mL/hr over 60 Minutes Intravenous Every 12 hours 09/12/18 1140 09/12/18 1722   09/13/18 1000  nafcillin injection 2 g  Status:  Discontinued     2 g Intravenous Every 4 hours 09/13/18 0852 09/13/18 0926   09/13/18 1000  nafcillin injection 2 g  Status:  Discontinued     2 g Intramuscular Every 4 hours 09/13/18 0926 09/13/18 0949   09/13/18 1000  nafcillin 2 g in sodium chloride 0.9 % 100 mL IVPB     2 g 200 mL/hr over 30 Minutes Intravenous Every 4 hours 09/13/18 0949 10/25/18 2359   09/13/18 0400  ceFAZolin (ANCEF) IVPB 2g/100 mL premix  Status:  Discontinued     2 g 200 mL/hr over 30 Minutes Intravenous Every 8 hours 09/12/18 2122 09/13/18 0852   09/12/18 1800  ceFAZolin (ANCEF) IVPB 2g/100 mL premix     2 g 200 mL/hr over 30 Minutes Intravenous Every 8 hours 09/12/18 1742 09/12/18 2050   09/12/18 1200  vancomycin (VANCOCIN) IVPB 1000 mg/200 mL premix     1,000 mg 200 mL/hr over 60 Minutes Intravenous  Once 09/12/18 1140 09/12/18 1700       Objective: Physical Exam: Vitals:   09/14/18 1644 09/14/18 1651 09/14/18 2344 09/15/18 0802  BP: (!) 83/54 108/71 (!) 92/59 91/60  Pulse: 69 70 72 68  Resp: 16 16 15 18   Temp:  (!) 97.5 F (36.4 C) 98.4 F (36.9 C) 97.7 F (36.5 C)  TempSrc:   Oral   SpO2: 99% 95%  100%  Weight:      Height:        Intake/Output Summary (Last 24 hours) at 09/15/2018 1324 Last data filed at 09/14/2018 1731 Gross per 24 hour  Intake 150 ml  Output  -  Net 150 ml   Filed Weights   09/12/18 1543 09/14/18 0433  Weight: 41.9 kg 46.3 kg   Gen: Awake, Alert, Oriented X 3, cachectic frail, sitting up in bed, no distress HEENT: PERRLA, Neck supple, no JVD Lungs: Poor air movement bilaterally CVS: RRR,No Gallops,Rubs or new Murmurs Abd: soft, Non tender, non distended, BS present Extremities: No edema, calf tenderness improving Skin: no new rashes  Data Reviewed: CBC: Recent Labs  Lab 09/12/18 0249 09/13/18 0353 09/14/18 0327  WBC 12.0* 9.5 6.8  NEUTROABS 8.4*  --   --   HGB 8.2* 8.0* 8.1*  HCT 27.1* 28.0* 28.0*  MCV 91.9 91.8 92.4  PLT 374 364 327   Basic Metabolic Panel: Recent Labs  Lab 09/12/18 0249 09/13/18 0004 09/13/18 0353 09/14/18 0327  NA 137  --  140 140  K 2.9* 3.4* 3.5 3.7  CL 103  --  106 107  CO2 24  --  27 28  GLUCOSE 97  --  97 111*  BUN 15  --  16 18  CREATININE 0.54  --  0.60 0.58  CALCIUM 8.8*  --  8.1* 8.1*  MG  --  1.8  --   --     Liver Function Tests: Recent Labs  Lab 09/12/18 0249  AST 12*  ALT 9  ALKPHOS 78  BILITOT 0.3  PROT 7.5  ALBUMIN 2.6*   No results for input(s): LIPASE, AMYLASE in the last 168 hours. No results for input(s): AMMONIA in the last 168 hours. Coagulation Profile: No results for input(s): INR, PROTIME in the last 168 hours. Cardiac Enzymes: No results for input(s): CKTOTAL, CKMB, CKMBINDEX, TROPONINI in the last 168 hours. BNP (last 3 results) No results for input(s): PROBNP in the last 8760 hours. CBG: Recent Labs  Lab 09/14/18 1156 09/14/18 1642 09/14/18 2116 09/15/18 0721 09/15/18 1151  GLUCAP 84 110* 95 84 94   Studies: Vas [place] Lower Extremity Venous (dvt)  Result Date: 09/14/2018  Lower Venous Study Indications: Pain.  Performing Technologist: Jeb Levering RDMS, RVT  Examination Guidelines: A complete evaluation includes B-mode imaging, spectral Doppler, color Doppler, and power Doppler as needed of all accessible portions of each vessel.  Bilateral testing is considered an integral part of a complete examination. Limited examinations for reoccurring indications may be performed as noted.  Right Venous Findings: +---------+---------------+---------+-----------+----------+-------+          CompressibilityPhasicitySpontaneityPropertiesSummary +---------+---------------+---------+-----------+----------+-------+ CFV      Full           Yes      Yes                          +---------+---------------+---------+-----------+----------+-------+ SFJ      Full                                                 +---------+---------------+---------+-----------+----------+-------+ FV Prox  Full                                                 +---------+---------------+---------+-----------+----------+-------+ FV Mid   Full                                                 +---------+---------------+---------+-----------+----------+-------+ FV DistalFull                                                 +---------+---------------+---------+-----------+----------+-------+ PFV      Full                                                 +---------+---------------+---------+-----------+----------+-------+ POP      Full           Yes      Yes                          +---------+---------------+---------+-----------+----------+-------+ PTV  Full                                                 +---------+---------------+---------+-----------+----------+-------+ PERO     Full                                                 +---------+---------------+---------+-----------+----------+-------+  Left Venous Findings: +---------+---------------+---------+-----------+----------+-------+          CompressibilityPhasicitySpontaneityPropertiesSummary +---------+---------------+---------+-----------+----------+-------+ CFV      Full           Yes      Yes                           +---------+---------------+---------+-----------+----------+-------+ SFJ      Full                                                 +---------+---------------+---------+-----------+----------+-------+ FV Prox  Full                                                 +---------+---------------+---------+-----------+----------+-------+ FV Mid   Full                                                 +---------+---------------+---------+-----------+----------+-------+ FV DistalFull                                                 +---------+---------------+---------+-----------+----------+-------+ PFV      Full                                                 +---------+---------------+---------+-----------+----------+-------+ POP      Full           Yes      Yes                          +---------+---------------+---------+-----------+----------+-------+ PTV      Full                                                 +---------+---------------+---------+-----------+----------+-------+ PERO     Full                                                 +---------+---------------+---------+-----------+----------+-------+  Summary: Right: There is no evidence of deep vein thrombosis in the lower extremity. No cystic structure found in the popliteal fossa. Left: There is no evidence of deep vein thrombosis in the lower extremity. No cystic structure found in the popliteal fossa.  *See table(s) above for measurements and observations. Electronically signed by Coral ElseVance Brabham MD on 09/14/2018 at 6:00:00 PM.    Final    [place] Ekg Site Rite  Result Date: 09/15/2018 If Site Rite image not attached, placement could not be confirmed due to current cardiac rhythm.    Time spent: 25 minutes  Author: Zannie CovePreetha Damyn Weitzel, MD

## 2018-09-16 LAB — GLUCOSE, CAPILLARY: Glucose-Capillary: 88 mg/dL (ref 70–99)

## 2018-09-16 MED ORDER — SODIUM CHLORIDE 0.9% FLUSH: 10.0000 mL | INTRAVENOUS | Status: DC | PRN

## 2018-09-16 MED ORDER — PREDNISONE 20 MG PO TABS
40.0000 mg | ORAL_TABLET | Freq: Once | ORAL | Status: AC
  Administered 2018-09-16: 40 mg via ORAL
  Filled 2018-09-16: qty 2

## 2018-09-16 MED ORDER — LIDOCAINE 5 % EX PTCH
1.0000 | MEDICATED_PATCH | CUTANEOUS | Status: DC
  Administered 2018-09-16 – 2018-09-21 (×6): 1 via TRANSDERMAL
  Filled 2018-09-16 (×6): qty 1

## 2018-09-16 MED ORDER — TRAMADOL HCL 50 MG PO TABS
50.0000 mg | ORAL_TABLET | Freq: Once | ORAL | Status: AC
  Administered 2018-09-16: 50 mg via ORAL
  Filled 2018-09-16: qty 1

## 2018-09-16 NOTE — Progress Notes (Signed)
[facility] Progress Note  Patient: Mackenzie Hughes PBD:[id number]   PCP: Patient, No Pcp Per DOB: May 31, 1981   DOA: 09/12/2018   DOS: 09/16/2018   Date of Service: the patient was seen and examined on 09/16/2018  Brief hospital course: Pt. with PMH of  IVDA; Hep C; remote ovarian CA; Wernicke's encephalopathy (7/19); and MRSA mitral valve endocarditis; admitted on 09/12/2018, presented with complaint of leg pain, was found to have ongoing MRSA endocarditis. admitted at [facility] and left [facility] 12/10; was admitted at [facility] 12/11 and left AMA 12/20. [facility] again on 09/07/18 left AMA.  septic pulmonary emboli as well as cerebral abscesses and renal and splenic infarctions. UDS in outside hospital was positive for amphetamines and opiates. -Stable on IV nafcillin  Subjective: c/o severe R shoulder pain  Assessment and Plan:  Recent MRSA bacteremia with endocarditis -08/17/18 Echo outside hospital with "large echodensity (1.5 x 2.0 cm) on the posterior mitral valve leaflet consistent with a vegetation with severe (4+) mitral regurgitation -Blood cultures -most recently positive as of 08/23/18 as well as 08/17/18  with MSSA resistant to Clinda and Erythromycin, previously in July 2019 was MRSA -Blood cultures now from 12/27 were negative. -Evaluated by [facility] Dr. Laneta Simmers, not felt to be a candidate for valve replacement at this time would completely agree with this assessment, he recommended follow-up after she completed antibiotic course and remained drug-free if she develops symptoms from heart failure -Continue IV Nacillin for 6weeks per ID recs -Last date of antibiotics is 2/18, ID also recommended oral surgery evaluation at some point  IV drug abuse with heroin dependence -Long-standing heroin dependence, has left AMA 3 times since July at outside hospitals -Started on Suboxone, per protocol and PRN clonidine  Wernicke encephalopathy -Patient previously diagnosed with Wernicke's -She was  treated with IV thiamine, although it is not clear that she completed her entire course  -Given high-dose thiamine for 3 days, transitioned to oral  Myositis -Patient with concern for myositis or early myonecrosis of the left calf during her last admission  Left calf pain and swelling -Could have been related to recent myositis -Improving, Dopplers negative for DVT  Right shoulder pain -Acute on chronic, supportive care, tramadol Toradol lidocaine patch -We will also give a one-time dose of steroid -If worsens or does not improve will need imaging  Tobacco dependence -Encourage cessation.   -This was discussed with the patient and should be reviewed on an ongoing basis.   -Patch ordered at patient request.  Anemia of chronic disease. Monitor serial H&H.  Severe protein calorie malnutrition. Underweight Body mass index is 16.99 kg/m.  Nutrition Problem: Severe Malnutrition Etiology: chronic illness(endocarditis and IVDA) Interventions: Interventions: Ensure Enlive (each supplement provides 350kcal and 20 grams of protein), Snacks   Pressure ulcer  Pressure Injury 03/06/18 Stage II -  Partial thickness loss of dermis presenting as a shallow open ulcer with a red, pink wound bed without slough. scabbed (Active)  03/06/18 1630  Location: Coccyx  Location Orientation: Mid  Staging: Stage II -  Partial thickness loss of dermis presenting as a shallow open ulcer with a red, pink wound bed without slough.  Wound Description (Comments): scabbed  Present on Admission: Yes     Diet: regular diet DVT Prophylaxis: subcutaneous Heparin  Advance goals of care discussion: full code  Family Communication: no family was present at bedside, at the time of interview.   Disposition: Home pending completion of IV antibiotics  Consultants: ID, CT surgery Procedures: none  Scheduled Meds: . buprenorphine-naloxone  2 tablet Sublingual QID  . famotidine  20 mg Oral Daily  . feeding  supplement (ENSURE ENLIVE)  237 mL Oral TID BM  . heparin injection (subcutaneous)  5,000 Units Subcutaneous Q8H  . lidocaine  1 patch Transdermal Q24H  . nicotine  14 mg Transdermal Daily  . predniSONE  40 mg Oral Once  . sodium chloride flush  3 mL Intravenous Q12H  . thiamine  100 mg Oral Daily   Continuous Infusions: . nafcillin IV Stopped (09/16/18 1229)   PRN Meds: acetaminophen **OR** acetaminophen, dicyclomine, hydrOXYzine, ketorolac, loperamide, methocarbamol, MUSCLE RUB, ondansetron **OR** ondansetron (ZOFRAN) IV, sodium chloride flush, traMADol-acetaminophen, traZODone Antibiotics: Anti-infectives (From admission, onward)   Start     Dose/Rate Route Frequency Ordered Stop   09/13/18 2300  vancomycin (VANCOCIN) IVPB 750 mg/150 ml premix  Status:  Discontinued     750 mg 150 mL/hr over 60 Minutes Intravenous Every 12 hours 09/12/18 1140 09/12/18 1722   09/13/18 1000  nafcillin injection 2 g  Status:  Discontinued     2 g Intravenous Every 4 hours 09/13/18 0852 09/13/18 0926   09/13/18 1000  nafcillin injection 2 g  Status:  Discontinued     2 g Intramuscular Every 4 hours 09/13/18 0926 09/13/18 0949   09/13/18 1000  nafcillin 2 g in sodium chloride 0.9 % 100 mL IVPB     2 g 200 mL/hr over 30 Minutes Intravenous Every 4 hours 09/13/18 0949 10/25/18 2359   09/13/18 0400  ceFAZolin (ANCEF) IVPB 2g/100 mL premix  Status:  Discontinued     2 g 200 mL/hr over 30 Minutes Intravenous Every 8 hours 09/12/18 2122 09/13/18 0852   09/12/18 1800  ceFAZolin (ANCEF) IVPB 2g/100 mL premix     2 g 200 mL/hr over 30 Minutes Intravenous Every 8 hours 09/12/18 1742 09/12/18 2050   09/12/18 1200  vancomycin (VANCOCIN) IVPB 1000 mg/200 mL premix     1,000 mg 200 mL/hr over 60 Minutes Intravenous  Once 09/12/18 1140 09/12/18 1700       Objective: Physical Exam: Vitals:   09/15/18 0802 09/15/18 1651 09/15/18 2257 09/16/18 0757  BP: 91/60 99/63 (!) 88/60 95/66  Pulse: 68 71 69 68  Resp: 18  18 18    Temp: 97.7 F (36.5 C) 97.7 F (36.5 C) 98.6 F (37 C) 98.2 F (36.8 C)  TempSrc:   Oral Oral  SpO2: 100% 99% 97% 94%  Weight:      Height:        Intake/Output Summary (Last 24 hours) at 09/16/2018 1433 Last data filed at 09/16/2018 0400 Gross per 24 hour  Intake 800 ml  Output -  Net 800 ml   Filed Weights   09/12/18 1543 09/14/18 0433  Weight: 41.9 kg 46.3 kg   Gen: Awake, Alert, Oriented X 3, cachectic frail, sitting up in bed, no distress HEENT: PERRLA, Neck supple, no JVD Lungs: Poor air movement bilaterally CVS: RRR,No Gallops,Rubs or new Murmurs Abd: soft, Non tender, non distended, BS present Extremities: No edema, calf tenderness improving Skin: no new rashes  Data Reviewed: CBC: Recent Labs  Lab 09/12/18 0249 09/13/18 0353 09/14/18 0327  WBC 12.0* 9.5 6.8  NEUTROABS 8.4*  --   --   HGB 8.2* 8.0* 8.1*  HCT 27.1* 28.0* 28.0*  MCV 91.9 91.8 92.4  PLT 374 364 327   Basic Metabolic Panel: Recent Labs  Lab 09/12/18 0249 09/13/18 0004 09/13/18 0353 09/14/18 0327  NA 137  --  140 140  K 2.9* 3.4* 3.5 3.7  CL 103  --  106 107  CO2 24  --  27 28  GLUCOSE 97  --  97 111*  BUN 15  --  16 18  CREATININE 0.54  --  0.60 0.58  CALCIUM 8.8*  --  8.1* 8.1*  MG  --  1.8  --   --     Liver Function Tests: Recent Labs  Lab 09/12/18 0249  AST 12*  ALT 9  ALKPHOS 78  BILITOT 0.3  PROT 7.5  ALBUMIN 2.6*   No results for input(s): LIPASE, AMYLASE in the last 168 hours. No results for input(s): AMMONIA in the last 168 hours. Coagulation Profile: No results for input(s): INR, PROTIME in the last 168 hours. Cardiac Enzymes: No results for input(s): CKTOTAL, CKMB, CKMBINDEX, TROPONINI in the last 168 hours. BNP (last 3 results) No results for input(s): PROBNP in the last 8760 hours. CBG: Recent Labs  Lab 09/15/18 0721 09/15/18 1151 09/15/18 1607 09/15/18 2157 09/16/18 0755  GLUCAP 84 94 101* 108* 88   Studies: No results found.   Time  spent: 25 minutes  Author: Zannie CovePreetha Yulonda [place], MD

## 2018-09-16 NOTE — Care Management Note (Signed)
Case Management Note  Patient Details  Name: Mackenzie Hughes MRN: [id number] Date of Birth: Jan 21, 1981  Subjective/Objective:  From home, here with Endocarditis, IVDU, got picc line placement, she will be here in hospital thru Feb 18 to receive IV abxs. Patient has a follow up apt scheduled with ID and a hospital follow up apt scheduled with [facility] for 2/20 at 9:30.                     Action/Plan: NCM will follow for transition of care needs.   Expected Discharge Date:  09/16/18               Expected Discharge Plan:  Home/Self Care  In-House Referral:  Clinical Social Work  Discharge planning Services  CM Consult  Post Acute Care Choice:    Choice offered to:     DME Arranged:    DME Agency:     HH Arranged:    HH Agency:     Status of Service:  In process, will continue to follow  If discussed at Long Length of Stay Meetings, dates discussed:    Additional Comments:  Leone Haven, RN 09/16/2018, 4:24 PM

## 2018-09-16 NOTE — Progress Notes (Signed)
VAST RN called unit and spoke with Annette Stable, RN regarding multiple IVT consults. VAST RN advised she was unsure of when a PICC could be placed. Bill, RN verbalized understanding and asked if PIV could be placed in meantime. This RN advised IVT would come and assess pt when able.

## 2018-09-17 LAB — CULTURE, BLOOD (ROUTINE X 2)
CULTURE: NO GROWTH
Culture: NO GROWTH

## 2018-09-17 LAB — GLUCOSE, CAPILLARY: Glucose-Capillary: 155 mg/dL — ABNORMAL HIGH (ref 70–99)

## 2018-09-17 NOTE — Progress Notes (Signed)
[facility] Progress Note  Patient: Mackenzie Hughes EML:[id number]   PCP: Patient, No Pcp Per DOB: 1981-08-31   DOA: 09/12/2018   DOS: 09/17/2018   Date of Service: the patient was seen and examined on 09/17/2018  Brief hospital course: Pt. with PMH of  IVDA; Hep C; remote ovarian CA; Wernicke's encephalopathy (7/19); and MRSA mitral valve endocarditis; admitted on 09/12/2018, presented with complaint of leg pain, was found to have ongoing MRSA endocarditis. admitted at [facility] and left [facility] 12/10; was admitted at [facility] 12/11 and left AMA 12/20. [facility] again on 09/07/18 left AMA.  septic pulmonary emboli as well as cerebral abscesses and renal and splenic infarctions. UDS in outside hospital was positive for amphetamines and opiates. -Stable on IV nafcillin  Subjective: Continues to have severe right shoulder pain and neck pain  Assessment and Plan:  Recent MRSA bacteremia with endocarditis -08/17/18 Echo outside hospital with "large echodensity (1.5 x 2.0 cm) on the posterior mitral valve leaflet consistent with a vegetation with severe (4+) mitral regurgitation -Blood cultures -most recently positive as of 08/23/18 as well as 08/17/18  with MSSA resistant to Clinda and Erythromycin, previously in July 2019 was MRSA -Blood cultures now from 12/27 were negative. -Evaluated by [facility] Dr. Laneta Simmers, not felt to be a candidate for valve replacement at this time would completely agree with this assessment, he recommended follow-up after she completed antibiotic course and remained drug-free if she develops symptoms from heart failure -Continue IV Nacillin for 6weeks per ID recs -Last date of antibiotics is 2/18, ID also recommended oral surgery evaluation at some point  Right shoulder and neck pain -Due to above comorbidities will check MRI C-spine and shoulder  IV drug abuse with heroin dependence -Long-standing heroin dependence, has left AMA 3 times since July at outside hospitals -Started  on Suboxone, per protocol and PRN clonidine  Wernicke encephalopathy -Patient previously diagnosed with Wernicke's -She was treated with IV thiamine, although it is not clear that she completed her entire course  -Given high-dose thiamine for 3 days, transitioned to oral -Stable, improved  Myositis -Patient with concern for myositis or early myonecrosis of the left calf during her last admission  Left calf pain and swelling -Could have been related to recent myositis -Improving, Dopplers negative for DVT  Tobacco dependence -Encourage cessation.   -This was discussed with the patient and should be reviewed on an ongoing basis.   -Patch ordered at patient request.  Anemia of chronic disease. Monitor serial H&H.  Severe protein calorie malnutrition. Underweight Body mass index is 16.99 kg/m.  Nutrition Problem: Severe Malnutrition Etiology: chronic illness(endocarditis and IVDA) Interventions: Interventions: Ensure Enlive (each supplement provides 350kcal and 20 grams of protein), Snacks   Pressure ulcer  Pressure Injury 03/06/18 Stage II -  Partial thickness loss of dermis presenting as a shallow open ulcer with a red, pink wound bed without slough. scabbed (Active)  03/06/18 1630  Location: Coccyx  Location Orientation: Mid  Staging: Stage II -  Partial thickness loss of dermis presenting as a shallow open ulcer with a red, pink wound bed without slough.  Wound Description (Comments): scabbed  Present on Admission: Yes     Diet: regular diet DVT Prophylaxis: subcutaneous Heparin  Advance goals of care discussion: full code  Family Communication: no family was present at bedside, at the time of interview.   Disposition: Home pending completion of IV antibiotics  Consultants: ID, CT surgery Procedures: none  Scheduled Meds: . buprenorphine-naloxone  2 tablet Sublingual  QID  . famotidine  20 mg Oral Daily  . feeding supplement (ENSURE ENLIVE)  237 mL Oral TID BM   . heparin injection (subcutaneous)  5,000 Units Subcutaneous Q8H  . lidocaine  1 patch Transdermal Q24H  . nicotine  14 mg Transdermal Daily  . sodium chloride flush  3 mL Intravenous Q12H  . thiamine  100 mg Oral Daily   Continuous Infusions: . nafcillin IV 2 g (09/17/18 0845)   PRN Meds: acetaminophen **OR** acetaminophen, ketorolac, MUSCLE RUB, ondansetron **OR** ondansetron (ZOFRAN) IV, sodium chloride flush, traMADol-acetaminophen, traZODone Antibiotics: Anti-infectives (From admission, onward)   Start     Dose/Rate Route Frequency Ordered Stop   09/13/18 2300  vancomycin (VANCOCIN) IVPB 750 mg/150 ml premix  Status:  Discontinued     750 mg 150 mL/hr over 60 Minutes Intravenous Every 12 hours 09/12/18 1140 09/12/18 1722   09/13/18 1000  nafcillin injection 2 g  Status:  Discontinued     2 g Intravenous Every 4 hours 09/13/18 0852 09/13/18 0926   09/13/18 1000  nafcillin injection 2 g  Status:  Discontinued     2 g Intramuscular Every 4 hours 09/13/18 0926 09/13/18 0949   09/13/18 1000  nafcillin 2 g in sodium chloride 0.9 % 100 mL IVPB     2 g 200 mL/hr over 30 Minutes Intravenous Every 4 hours 09/13/18 0949 10/25/18 2359   09/13/18 0400  ceFAZolin (ANCEF) IVPB 2g/100 mL premix  Status:  Discontinued     2 g 200 mL/hr over 30 Minutes Intravenous Every 8 hours 09/12/18 2122 09/13/18 0852   09/12/18 1800  ceFAZolin (ANCEF) IVPB 2g/100 mL premix     2 g 200 mL/hr over 30 Minutes Intravenous Every 8 hours 09/12/18 1742 09/12/18 2050   09/12/18 1200  vancomycin (VANCOCIN) IVPB 1000 mg/200 mL premix     1,000 mg 200 mL/hr over 60 Minutes Intravenous  Once 09/12/18 1140 09/12/18 1700       Objective: Physical Exam: Vitals:   09/16/18 0757 09/16/18 1739 09/16/18 2233 09/17/18 0820  BP: 95/66 97/65 99/63  (!) 89/59  Pulse: 68 63 67 62  Resp:   16 12  Temp: 98.2 F (36.8 C) 98.7 F (37.1 C) 98.7 F (37.1 C) 98.8 F (37.1 C)  TempSrc: Oral Oral Oral Oral  SpO2: 94% 100%  94% 99%  Weight:      Height:        Intake/Output Summary (Last 24 hours) at 09/17/2018 1155 Last data filed at 09/17/2018 0400 Gross per 24 hour  Intake 1197.67 ml  Output -  Net 1197.67 ml   Filed Weights   09/12/18 1543 09/14/18 0433  Weight: 41.9 kg 46.3 kg   Gen: Frail cachectic female awake, Alert, Oriented X 3, no distress HEENT: PERRLA, Neck supple, no JVD Lungs: Good air movement bilaterally, CTAB CVS: RRR,No Gallops,Rubs or new Murmurs Abd: soft, Non tender, non distended, BS present Extremities: Right shoulder with tenderness close to midline, and painful range of motion  skin: no new rashes   Data Reviewed: CBC: Recent Labs  Lab 09/12/18 0249 09/13/18 0353 09/14/18 0327  WBC 12.0* 9.5 6.8  NEUTROABS 8.4*  --   --   HGB 8.2* 8.0* 8.1*  HCT 27.1* 28.0* 28.0*  MCV 91.9 91.8 92.4  PLT 374 364 327   Basic Metabolic Panel: Recent Labs  Lab 09/12/18 0249 09/13/18 0004 09/13/18 0353 09/14/18 0327  NA 137  --  140 140  K 2.9* 3.4* 3.5 3.7  CL 103  --  106 107  CO2 24  --  27 28  GLUCOSE 97  --  97 111*  BUN 15  --  16 18  CREATININE 0.54  --  0.60 0.58  CALCIUM 8.8*  --  8.1* 8.1*  MG  --  1.8  --   --     Liver Function Tests: Recent Labs  Lab 09/12/18 0249  AST 12*  ALT 9  ALKPHOS 78  BILITOT 0.3  PROT 7.5  ALBUMIN 2.6*   No results for input(s): LIPASE, AMYLASE in the last 168 hours. No results for input(s): AMMONIA in the last 168 hours. Coagulation Profile: No results for input(s): INR, PROTIME in the last 168 hours. Cardiac Enzymes: No results for input(s): CKTOTAL, CKMB, CKMBINDEX, TROPONINI in the last 168 hours. BNP (last 3 results) No results for input(s): PROBNP in the last 8760 hours. CBG: Recent Labs  Lab 09/15/18 1151 09/15/18 1607 09/15/18 2157 09/16/18 0755 09/17/18 1124  GLUCAP 94 101* 108* 88 155*   Studies: No results found.   Time spent: 25 minutes  Author: Zannie Cove, MD

## 2018-09-18 ENCOUNTER — Inpatient Hospital Stay (HOSPITAL_COMMUNITY): Payer: Medicaid Other

## 2018-09-18 LAB — CBC
HCT: 28 % — ABNORMAL LOW (ref 36.0–46.0)
Hemoglobin: 8.3 g/dL — ABNORMAL LOW (ref 12.0–15.0)
MCH: 27.9 pg (ref 26.0–34.0)
MCHC: 29.6 g/dL — ABNORMAL LOW (ref 30.0–36.0)
MCV: 94 fL (ref 80.0–100.0)
Platelets: 341 10*3/uL (ref 150–400)
RBC: 2.98 MIL/uL — ABNORMAL LOW (ref 3.87–5.11)
RDW: 21.6 % — AB (ref 11.5–15.5)
WBC: 4.6 10*3/uL (ref 4.0–10.5)
nRBC: 0 % (ref 0.0–0.2)

## 2018-09-18 LAB — BASIC METABOLIC PANEL
Anion gap: 8 (ref 5–15)
BUN: 18 mg/dL (ref 6–20)
CO2: 25 mmol/L (ref 22–32)
Calcium: 8.3 mg/dL — ABNORMAL LOW (ref 8.9–10.3)
Chloride: 106 mmol/L (ref 98–111)
Creatinine, Ser: 0.63 mg/dL (ref 0.44–1.00)
GFR calc Af Amer: >60 mL/min (ref >60)
GFR calc non Af Amer: >60 mL/min (ref >60)
Glucose, Bld: 99 mg/dL (ref 70–99)
Potassium: 4 mmol/L (ref 3.5–5.1)
SODIUM: 139 mmol/L (ref 135–145)

## 2018-09-18 MED ORDER — BUPRENORPHINE HCL-NALOXONE HCL 2-0.5 MG SL SUBL: 2.0000 | SUBLINGUAL_TABLET | Freq: Two times a day (BID) | SUBLINGUAL | Status: DC

## 2018-09-18 MED ORDER — KETOROLAC TROMETHAMINE 15 MG/ML IJ SOLN
15.0000 mg | Freq: Three times a day (TID) | INTRAMUSCULAR | Status: DC | PRN
  Administered 2018-09-18 – 2018-09-21 (×7): 15 mg via INTRAVENOUS
  Filled 2018-09-18 (×8): qty 1

## 2018-09-18 MED ORDER — BUPRENORPHINE HCL-NALOXONE HCL 2-0.5 MG SL SUBL
3.0000 | SUBLINGUAL_TABLET | Freq: Two times a day (BID) | SUBLINGUAL | Status: DC
  Administered 2018-09-18 – 2018-09-21 (×6): 3 via SUBLINGUAL
  Filled 2018-09-18 (×7): qty 3

## 2018-09-18 MED ORDER — BUPRENORPHINE HCL-NALOXONE HCL 2-0.5 MG SL SUBL: 3.0000 | SUBLINGUAL_TABLET | Freq: Two times a day (BID) | SUBLINGUAL | Status: DC

## 2018-09-18 NOTE — Progress Notes (Signed)
Attempted to get patient for MRI at 12am 1/12, patient refused to come stating she will have her MRI in the am.

## 2018-09-18 NOTE — Progress Notes (Signed)
Patient advised that she is to go for an MRI as soon as her antibiotic is through infusing.  She stated "I am not going down in the middle of the night.  I will go tomorrow."  I did ask her if she was able to go in the scanner and she said she will need medication to help her stay in the MRI scanner.  MRI notified that patient is refusing tonight.  FYI to MD regarding refusal.

## 2018-09-18 NOTE — Progress Notes (Signed)
Pt refusing MRI exams. RN aware.

## 2018-09-18 NOTE — Progress Notes (Signed)
Pt again refusing MRI this morning, after pt was agreeable and premedicated with pain meds for MRI. Pt educated on importance of getting MRI and Jomarie Longs MD notified.

## 2018-09-18 NOTE — Progress Notes (Signed)
[facility] Progress Note  Patient: Mackenzie HoldingCarla T Thorstenson AVW:[id number]   PCP: Patient, No Pcp Per DOB: 05-12-1981   DOA: 09/12/2018   DOS: 09/18/2018   Date of Service: the patient was seen and examined on 09/18/2018  Brief hospital course: Pt. with PMH of  IVDA; Hep C; remote ovarian CA; Wernicke's encephalopathy (7/19); and MRSA mitral valve endocarditis; admitted on 09/12/2018, presented with complaint of leg pain, was found to have ongoing MRSA endocarditis. [facility]. [facility] again on 09/07/18 left AMA.  septic pulmonary emboli as well as cerebral abscesses and renal and splenic infarctions. UDS in outside hospital was positive for amphetamines and opiates. -Stable on IV nafcillin  Subjective: feels that Suboxone QID is causing more withdrawal R shoulder and neck is better, improving  Assessment and Plan:  Recent MRSA bacteremia with endocarditis -08/17/18 Echo outside hospital with "large echodensity (1.5 x 2.0 cm) on the posterior mitral valve leaflet consistent with a vegetation with severe (4+) mitral regurgitation -Blood cultures -most recently positive as of 08/23/18 as well as 08/17/18  with MSSA resistant to Clinda and Erythromycin, previously in July 2019 was MRSA -Blood cultures now from 12/27 were negative. -Evaluated by [facility] Dr. Laneta SimmersBartle, not felt to be a candidate for valve replacement at this time would completely agree with this assessment, he recommended follow-up after she completed antibiotic course and remained drug-free if she develops symptoms from heart failure -Continue IV Nacillin for 6weeks per ID recs -Last date of antibiotics is 2/18, ID also recommended oral surgery evaluation at some point  Right shoulder and neck pain -Due to above comorbidities ordered MRI C-spine and shoulder -Pt refused MRI x2, now reports pain is much better  IV drug abuse with heroin dependence -Long-standing  heroin dependence, has left AMA 3 times since July at outside hospitals -Started on Suboxone, per protocol cut down dose to BID per pt request  Wernicke encephalopathy -Patient previously diagnosed with Wernicke's -She was treated with IV thiamine, although it is not clear that she completed her entire course  -Given high-dose thiamine for 3 days, transitioned to oral -Stable, improved  Myositis -Patient with concern for myositis or early myonecrosis of the left calf during her last admission  Left calf pain and swelling -Could have been related to recent myositis -Improving, Dopplers negative for DVT  Tobacco dependence -Encourage cessation.   -This was discussed with the patient and should be reviewed on an ongoing basis.   -Patch ordered at patient request.  Anemia of chronic disease. Monitor serial H&H.  Severe protein calorie malnutrition. Underweight Body mass index is 16.99 kg/m.  Nutrition Problem: Severe Malnutrition Etiology: chronic illness(endocarditis and IVDA) Interventions: Interventions: Ensure Enlive (each supplement provides 350kcal and 20 grams of protein), Snacks   Pressure ulcer  Pressure Injury 03/06/18 Stage II -  Partial thickness loss of dermis presenting as a shallow open ulcer with a red, pink wound bed without slough. scabbed (Active)  03/06/18 1630  Location: Coccyx  Location Orientation: Mid  Staging: Stage II -  Partial thickness loss of dermis presenting as a shallow open ulcer with a red, pink wound bed without slough.  Wound Description (Comments): scabbed  Present on Admission: Yes     Diet: regular diet DVT Prophylaxis: subcutaneous Heparin  Advance goals of care discussion: full code  Family Communication: no family was present at bedside, at the time of interview.   Disposition: Home pending completion  of IV antibiotics  Consultants: ID, CT surgery Procedures: none  Scheduled Meds: . buprenorphine-naloxone  2 tablet  Sublingual BID  . famotidine  20 mg Oral Daily  . feeding supplement (ENSURE ENLIVE)  237 mL Oral TID BM  . heparin injection (subcutaneous)  5,000 Units Subcutaneous Q8H  . lidocaine  1 patch Transdermal Q24H  . nicotine  14 mg Transdermal Daily  . sodium chloride flush  3 mL Intravenous Q12H  . thiamine  100 mg Oral Daily   Continuous Infusions: . nafcillin IV 2 g (09/18/18 1158)   PRN Meds: acetaminophen **OR** acetaminophen, ketorolac, MUSCLE RUB, ondansetron **OR** ondansetron (ZOFRAN) IV, sodium chloride flush, traMADol-acetaminophen, traZODone Antibiotics: Anti-infectives (From admission, onward)   Start     Dose/Rate Route Frequency Ordered Stop   09/13/18 2300  vancomycin (VANCOCIN) IVPB 750 mg/150 ml premix  Status:  Discontinued     750 mg 150 mL/hr over 60 Minutes Intravenous Every 12 hours 09/12/18 1140 09/12/18 1722   09/13/18 1000  nafcillin injection 2 g  Status:  Discontinued     2 g Intravenous Every 4 hours 09/13/18 0852 09/13/18 0926   09/13/18 1000  nafcillin injection 2 g  Status:  Discontinued     2 g Intramuscular Every 4 hours 09/13/18 0926 09/13/18 0949   09/13/18 1000  nafcillin 2 g in sodium chloride 0.9 % 100 mL IVPB     2 g 200 mL/hr over 30 Minutes Intravenous Every 4 hours 09/13/18 0949 10/25/18 2359   09/13/18 0400  ceFAZolin (ANCEF) IVPB 2g/100 mL premix  Status:  Discontinued     2 g 200 mL/hr over 30 Minutes Intravenous Every 8 hours 09/12/18 2122 09/13/18 0852   09/12/18 1800  ceFAZolin (ANCEF) IVPB 2g/100 mL premix     2 g 200 mL/hr over 30 Minutes Intravenous Every 8 hours 09/12/18 1742 09/12/18 2050   09/12/18 1200  vancomycin (VANCOCIN) IVPB 1000 mg/200 mL premix     1,000 mg 200 mL/hr over 60 Minutes Intravenous  Once 09/12/18 1140 09/12/18 1700       Objective: Physical Exam: Vitals:   09/17/18 0820 09/17/18 1604 09/17/18 2117 09/18/18 0750  BP: (!) 89/59 96/62 (!) 88/55 (!) 90/57  Pulse: 62 76 63 64  Resp: 12 13 15    Temp: 98.8  F (37.1 C) 98.9 F (37.2 C) 98.3 F (36.8 C) 98.9 F (37.2 C)  TempSrc: Oral Oral Oral Oral  SpO2: 99% 100% 100% 99%  Weight:      Height:        Intake/Output Summary (Last 24 hours) at 09/18/2018 1319 Last data filed at 09/17/2018 2028 Gross per 24 hour  Intake 400 ml  Output -  Net 400 ml   Filed Weights   09/12/18 1543 09/14/18 0433  Weight: 41.9 kg 46.3 kg   Gen: Awake, Alert, Oriented X 3, frail, cachectic, no distress HEENT: PERRLA, Neck supple, no JVD Lungs: CTAB CVS: RRR,No Gallops,Rubs or new Murmurs Abd: soft, Non tender, non distended, BS present Extremities: Right shoulder with tenderness close to midline, and painful range of motion , improved skin: no new rashes   Data Reviewed: CBC: Recent Labs  Lab 09/12/18 0249 09/13/18 0353 09/14/18 0327 09/18/18 0312  WBC 12.0* 9.5 6.8 4.6  NEUTROABS 8.4*  --   --   --   HGB 8.2* 8.0* 8.1* 8.3*  HCT 27.1* 28.0* 28.0* 28.0*  MCV 91.9 91.8 92.4 94.0  PLT 374 364 327 341   Basic Metabolic Panel:  Recent Labs  Lab 09/12/18 0249 09/13/18 0004 09/13/18 0353 09/14/18 0327 09/18/18 0312  NA 137  --  140 140 139  K 2.9* 3.4* 3.5 3.7 4.0  CL 103  --  106 107 106  CO2 24  --  27 28 25   GLUCOSE 97  --  97 111* 99  BUN 15  --  16 18 18   CREATININE 0.54  --  0.60 0.58 0.63  CALCIUM 8.8*  --  8.1* 8.1* 8.3*  MG  --  1.8  --   --   --     Liver Function Tests: Recent Labs  Lab 09/12/18 0249  AST 12*  ALT 9  ALKPHOS 78  BILITOT 0.3  PROT 7.5  ALBUMIN 2.6*   No results for input(s): LIPASE, AMYLASE in the last 168 hours. No results for input(s): AMMONIA in the last 168 hours. Coagulation Profile: No results for input(s): INR, PROTIME in the last 168 hours. Cardiac Enzymes: No results for input(s): CKTOTAL, CKMB, CKMBINDEX, TROPONINI in the last 168 hours. BNP (last 3 results) No results for input(s): PROBNP in the last 8760 hours. CBG: Recent Labs  Lab 09/15/18 1151 09/15/18 1607 09/15/18 2157  09/16/18 0755 09/17/18 1124  GLUCAP 94 101* 108* 88 155*   Studies: No results found.   Time spent: 25 minutes  Author: Zannie CovePreetha Viyan Rosamond, MD

## 2018-09-20 MED ORDER — HYDROCODONE-ACETAMINOPHEN 5-325 MG PO TABS
1.0000 | ORAL_TABLET | Freq: Once | ORAL | Status: AC
  Administered 2018-09-20: 1 via ORAL
  Filled 2018-09-20: qty 1

## 2018-09-20 MED ORDER — LORAZEPAM 2 MG/ML IJ SOLN
0.5000 mg | Freq: Once | INTRAMUSCULAR | Status: AC
  Administered 2018-09-21: 0.5 mg via INTRAVENOUS
  Filled 2018-09-20: qty 1

## 2018-09-20 NOTE — Progress Notes (Signed)
Nutrition Follow Up  DOCUMENTATION CODES:   Severe malnutrition in context of chronic illness  INTERVENTION:    Ensure Enlive po BID, each supplement provides 350 kcal and 20 grams of protein  NUTRITION DIAGNOSIS:   Severe Malnutrition related to chronic illness(endocarditis and IVDA) as evidenced by energy intake < or equal to 75% for > or equal to 1 month, severe fat depletion, severe muscle depletion, ongoing  GOAL:   Patient will meet greater than or equal to 90% of their needs, met  MONITOR:   PO intake, Supplement acceptance, Labs, Skin, Weight trends  ASSESSMENT:   Pt with PMH of homelessness, IVDA, hepatitis C, remote ovarian cancer, Wernicke's encephalopathy (7/19), MV endocarditis, multiple hospitalizations leaving AMA who is now admitted with MSSA infection, cerebritis, bilateral calf myositis, splenic and renal infarcts d/t drug use.   Myositis [M60.9] IV drug abuse ([place]) [F19.10] Subacute bacterial endocarditis [I33.0] Opiate dependence ([place]) [F11.20]  Pt reports a good appetite. PO intake 100% per flowsheet records. Drinking her Ensure Enlive nutrition supplements.  Evaluated by [facility]; not candidate for valve replacement. Plan for IV Nacillin for 6 weeks per ID recommendation. Medications include thiamine and pepcid.  Pt having significant R should and neck pain. MRI of C-spine and shoulder pending.  Diet Order:   Diet Order            Diet regular Room service appropriate? Yes; Fluid consistency: Thin  Diet effective now             EDUCATION NEEDS:   Education needs have been addressed  Skin:  Skin Assessment: Reviewed RN Assessment  Last BM:  1/13  Height:   Ht Readings from Last 1 Encounters:  09/12/18 _0  (1.651 m)   Weight:   Wt Readings from Last 1 Encounters:  09/14/18 46.3 kg   Ideal Body Weight:  (P) 56.8 kg  BMI:  Body mass index is 16.99 kg/m.  Estimated Nutritional Needs:   Kcal:  1500-1700  Protein:  80-90  gm  Fluid:  1.5-1.7 L  Arthur Holms, RD, LDN Pager #: [phone] After-Hours Pager #: [phone]

## 2018-09-20 NOTE — Progress Notes (Signed)
[facility] Progress Note  Patient: Mackenzie Hughes WJX:[id number]   PCP: Patient, No Pcp Per DOB: 07/05/1981   DOA: 09/12/2018   DOS: 09/20/2018   Date of Service: the patient was seen and examined on 09/20/2018  Brief hospital course: Pt. with PMH of  IVDA; Hep C; remote ovarian CA; Wernicke's encephalopathy (7/19); and MRSA mitral valve endocarditis; admitted on 09/12/2018, presented with complaint of leg pain, was found to have ongoing MRSA endocarditis. admitted at [facility] and left [facility] 12/10; was admitted at [facility] 12/11 and left AMA 12/20. [facility] again on 09/07/18 left AMA.  septic pulmonary emboli as well as cerebral abscesses and renal and splenic infarctions. UDS in outside hospital was positive for amphetamines and opiates. -Stable on IV nafcillin  Subjective: feels that Suboxone QID is causing more withdrawal R shoulder and neck is better, improving  Assessment and Plan:  Recent MRSA bacteremia with endocarditis -08/17/18 Echo outside hospital with "large echodensity (1.5 x 2.0 cm) on the posterior mitral valve leaflet consistent with a vegetation with severe (4+) mitral regurgitation -Blood cultures -most recently positive as of 08/23/18 as well as 08/17/18  with MSSA resistant to Clinda and Erythromycin, previously in July 2019 was MRSA -Blood cultures now from 12/27 were negative. -Evaluated by [facility] Dr. Laneta Simmers, not felt to be a candidate for valve replacement at this time would completely agree with this assessment, he recommended follow-up after she completed antibiotic course and remained drug-free if she develops symptoms from heart failure -Continue IV Nacillin for 6weeks per ID recs -Last date of antibiotics is 2/18, ID also recommended oral surgery evaluation at some point -Stable  Right shoulder and neck pain -Due to above comorbidities ordered MRI C-spine and shoulder -Pt refused MRI x2, and reported pain had much improved however since yesterday afternoon  apparently this is much worse now, patient is crying in pain -Reorder MRI  IV drug abuse with heroin dependence -Long-standing heroin dependence, has left AMA 3 times since July at outside hospitals -Started on Suboxone, per protocol cut down dose to BID per pt request  Wernicke encephalopathy -Patient previously diagnosed with Wernicke's -She was treated with IV thiamine, although it is not clear that she completed her entire course  -Given high-dose thiamine for 3 days, transitioned to oral -Stable, improved  Myositis -Patient with concern for myositis or early myonecrosis of the left calf during her last admission -Resolved  Left calf pain and swelling -Could have been related to recent myositis -Improving, Dopplers negative for DVT  Tobacco dependence -Encourage cessation.   -This was discussed with the patient and should be reviewed on an ongoing basis.   -Patch ordered at patient request.  Anemia of chronic disease. Monitor serial H&H.  Severe protein calorie malnutrition. Underweight Body mass index is 16.99 kg/m.  Nutrition Problem: Severe Malnutrition Etiology: chronic illness(endocarditis and IVDA) Interventions: Interventions: Ensure Enlive (each supplement provides 350kcal and 20 grams of protein), Snacks   Pressure ulcer  Pressure Injury 03/06/18 Stage II -  Partial thickness loss of dermis presenting as a shallow open ulcer with a red, pink wound bed without slough. scabbed (Active)  03/06/18 1630  Location: Coccyx  Location Orientation: Mid  Staging: Stage II -  Partial thickness loss of dermis presenting as a shallow open ulcer with a red, pink wound bed without slough.  Wound Description (Comments): scabbed  Present on Admission: Yes     Diet: regular diet DVT Prophylaxis: subcutaneous Heparin  Advance goals of care discussion: full code  Family Communication: no family was present at bedside  Disposition: Home pending completion of IV  antibiotics  Consultants: ID, CT surgery Procedures: none  Scheduled Meds: . buprenorphine-naloxone  3 tablet Sublingual BID  . famotidine  20 mg Oral Daily  . feeding supplement (ENSURE ENLIVE)  237 mL Oral TID BM  . heparin injection (subcutaneous)  5,000 Units Subcutaneous Q8H  . lidocaine  1 patch Transdermal Q24H  . nicotine  14 mg Transdermal Daily  . sodium chloride flush  3 mL Intravenous Q12H  . thiamine  100 mg Oral Daily   Continuous Infusions: . nafcillin IV 2 g (09/20/18 0756)   PRN Meds: acetaminophen **OR** acetaminophen, ketorolac, MUSCLE RUB, ondansetron **OR** ondansetron (ZOFRAN) IV, sodium chloride flush, traMADol-acetaminophen, traZODone Antibiotics: Anti-infectives (From admission, onward)   Start     Dose/Rate Route Frequency Ordered Stop   09/13/18 2300  vancomycin (VANCOCIN) IVPB 750 mg/150 ml premix  Status:  Discontinued     750 mg 150 mL/hr over 60 Minutes Intravenous Every 12 hours 09/12/18 1140 09/12/18 1722   09/13/18 1000  nafcillin injection 2 g  Status:  Discontinued     2 g Intravenous Every 4 hours 09/13/18 0852 09/13/18 0926   09/13/18 1000  nafcillin injection 2 g  Status:  Discontinued     2 g Intramuscular Every 4 hours 09/13/18 0926 09/13/18 0949   09/13/18 1000  nafcillin 2 g in sodium chloride 0.9 % 100 mL IVPB     2 g 200 mL/hr over 30 Minutes Intravenous Every 4 hours 09/13/18 0949 10/25/18 2359   09/13/18 0400  ceFAZolin (ANCEF) IVPB 2g/100 mL premix  Status:  Discontinued     2 g 200 mL/hr over 30 Minutes Intravenous Every 8 hours 09/12/18 2122 09/13/18 0852   09/12/18 1800  ceFAZolin (ANCEF) IVPB 2g/100 mL premix     2 g 200 mL/hr over 30 Minutes Intravenous Every 8 hours 09/12/18 1742 09/12/18 2050   09/12/18 1200  vancomycin (VANCOCIN) IVPB 1000 mg/200 mL premix     1,000 mg 200 mL/hr over 60 Minutes Intravenous  Once 09/12/18 1140 09/12/18 1700       Objective: Physical Exam: Vitals:   09/19/18 1738 09/20/18 0121  09/20/18 0846 09/20/18 0848  BP: 102/71 90/61 (!) 88/58 (!) 95/58  Pulse: 70 64 75 72  Resp: 18 (!) 8 18   Temp: 97.8 F (36.6 C) (!) 97.5 F (36.4 C) 97.8 F (36.6 C)   TempSrc:  Oral    SpO2: 100% 95% 99% 99%  Weight:      Height:        Intake/Output Summary (Last 24 hours) at 09/20/2018 1137 Last data filed at 09/20/2018 0920 Gross per 24 hour  Intake 1777 ml  Output 1 ml  Net 1776 ml   Filed Weights   09/12/18 1543 09/14/18 0433  Weight: 41.9 kg 46.3 kg   Gen: Debilitated, chronically ill female awake, Alert, Oriented X 3,  HEENT: PERRLA, Neck supple, no JVD Lungs: Good air movement bilaterally, CTAB CVS: RRR,No Gallops,Rubs or new Murmurs Abd: soft, Non tender, non distended, BS present Extremities: Right shoulder with mild tenderness and painful range of motion Skin: no new rashes  Data Reviewed: CBC: Recent Labs  Lab 09/14/18 0327 09/18/18 0312  WBC 6.8 4.6  HGB 8.1* 8.3*  HCT 28.0* 28.0*  MCV 92.4 94.0  PLT 327 341   Basic Metabolic Panel: Recent Labs  Lab 09/14/18 0327 09/18/18 0312  NA 140 139  K  3.7 4.0  CL 107 106  CO2 28 25  GLUCOSE 111* 99  BUN 18 18  CREATININE 0.58 0.63  CALCIUM 8.1* 8.3*    Liver Function Tests: No results for input(s): AST, ALT, ALKPHOS, BILITOT, PROT, ALBUMIN in the last 168 hours. No results for input(s): LIPASE, AMYLASE in the last 168 hours. No results for input(s): AMMONIA in the last 168 hours. Coagulation Profile: No results for input(s): INR, PROTIME in the last 168 hours. Cardiac Enzymes: No results for input(s): CKTOTAL, CKMB, CKMBINDEX, TROPONINI in the last 168 hours. BNP (last 3 results) No results for input(s): PROBNP in the last 8760 hours. CBG: Recent Labs  Lab 09/15/18 1151 09/15/18 1607 09/15/18 2157 09/16/18 0755 09/17/18 1124  GLUCAP 94 101* 108* 88 155*   Studies: No results found.   Time spent: 25 minutes  Author: Zannie CovePreetha Cortana Vanderford, MD

## 2018-09-21 ENCOUNTER — Inpatient Hospital Stay (HOSPITAL_COMMUNITY): Payer: Medicaid Other

## 2018-09-21 MED ORDER — HYDROCODONE-ACETAMINOPHEN 5-325 MG PO TABS
1.0000 | ORAL_TABLET | Freq: Once | ORAL | Status: AC
  Administered 2018-09-21: 1 via ORAL
  Filled 2018-09-21: qty 1

## 2018-09-21 NOTE — Progress Notes (Signed)
[facility] Progress Note  Patient: Mackenzie Hughes OVZ:[id number]   PCP: Patient, No Pcp Per DOB: 1980-11-02   DOA: 09/12/2018   DOS: 09/21/2018   Date of Service: the patient was seen and examined on 09/21/2018  Brief hospital course: Pt. with PMH of  IVDA; Hep C; remote ovarian CA; Wernicke's encephalopathy (7/19); and MRSA mitral valve endocarditis; admitted on 09/12/2018, presented with complaint of leg pain, was found to have ongoing MRSA endocarditis. admitted at [facility] and left [facility] 12/10; was admitted at [facility] 12/11 and left AMA 12/20. [facility] again on 09/07/18 left AMA.  septic pulmonary emboli as well as cerebral abscesses and renal and splenic infarctions. UDS in outside hospital was positive for amphetamines and opiates. -Stable on IV nafcillin -Through her hospital stay started complaining of worsening right shoulder pain and limited range of motion, MRI pending patient declined this several days ago now agrees to this  Subjective:   Assessment and Plan:  Recent MRSA bacteremia with endocarditis -08/17/18 Echo outside hospital with "large echodensity (1.5 x 2.0 cm) on the posterior mitral valve leaflet consistent with a vegetation with severe (4+) mitral regurgitation -Blood cultures -most recently positive as of 08/23/18 as well as 08/17/18  with MSSA resistant to Clinda and Erythromycin, previously in July 2019 was MRSA -Blood cultures now from 12/27 were negative. -Evaluated by [facility] Dr. Laneta Simmers, not felt to be a candidate for valve replacement at this time would completely agree with this assessment, he recommended follow-up after she completed antibiotic course and remained drug-free if she develops symptoms from heart failure -Continue IV Nacillin for 6weeks per ID recs -Last date of antibiotics is 2/18, ID also recommended oral surgery evaluation at some point -Remains stable  Right shoulder and neck pain -Due to above comorbidities ordered MRI C-spine and  shoulder -Pt refused MRI x2, and reported pain had much improved however since 1/13 afternoon cortically worsened again, range of motion is limited,  -Reordered MRI, pending at this time  -Given single dose of Vicodin in a couple of days due to this  IV drug abuse with heroin dependence -Long-standing heroin dependence, has left AMA 3 times since July at outside hospitals -Started on Suboxone, per protocol cut down dose to BID per pt request  Wernicke encephalopathy -Patient previously diagnosed with Wernicke's -She was treated with IV thiamine, although it is not clear that she completed her entire course  -Given high-dose thiamine for 3 days, transitioned to oral -Stable, improved  Myositis -Patient with concern for myositis or early myonecrosis of the left calf during her last admission -Resolved  Left calf pain and swelling -Could have been related to recent myositis -Improving, Dopplers negative for DVT  Tobacco dependence -Encourage cessation.   -This was discussed with the patient and should be reviewed on an ongoing basis.   -Patch ordered at patient request.  Anemia of chronic disease. Monitor serial H&H.  Severe protein calorie malnutrition. Underweight Body mass index is 16.99 kg/m.  Nutrition Problem: Severe Malnutrition Etiology: chronic illness(endocarditis and IVDA) Interventions: Interventions: Ensure Enlive (each supplement provides 350kcal and 20 grams of protein), Snacks   Pressure ulcer  Pressure Injury 03/06/18 Stage II -  Partial thickness loss of dermis presenting as a shallow open ulcer with a red, pink wound bed without slough. scabbed (Active)  03/06/18 1630  Location: Coccyx  Location Orientation: Mid  Staging: Stage II -  Partial thickness loss of dermis presenting as a shallow open ulcer with a red, pink wound bed without  slough.  Wound Description (Comments): scabbed  Present on Admission: Yes     Diet: regular diet DVT Prophylaxis:  subcutaneous Heparin  Advance goals of care discussion: full code  Family Communication: no family was present at bedside  Disposition: Home pending completion of IV antibiotics  Consultants: ID, CT surgery Procedures: none  Scheduled Meds: . buprenorphine-naloxone  3 tablet Sublingual BID  . famotidine  20 mg Oral Daily  . feeding supplement (ENSURE ENLIVE)  237 mL Oral TID BM  . heparin injection (subcutaneous)  5,000 Units Subcutaneous Q8H  . HYDROcodone-acetaminophen  1 tablet Oral Once  . lidocaine  1 patch Transdermal Q24H  . LORazepam  0.5 mg Intravenous Once  . nicotine  14 mg Transdermal Daily  . sodium chloride flush  3 mL Intravenous Q12H  . thiamine  100 mg Oral Daily   Continuous Infusions: . nafcillin IV 2 g (09/21/18 0801)   PRN Meds: acetaminophen **OR** acetaminophen, ketorolac, MUSCLE RUB, ondansetron **OR** ondansetron (ZOFRAN) IV, sodium chloride flush, traMADol-acetaminophen, traZODone Antibiotics: Anti-infectives (From admission, onward)   Start     Dose/Rate Route Frequency Ordered Stop   09/13/18 2300  vancomycin (VANCOCIN) IVPB 750 mg/150 ml premix  Status:  Discontinued     750 mg 150 mL/hr over 60 Minutes Intravenous Every 12 hours 09/12/18 1140 09/12/18 1722   09/13/18 1000  nafcillin injection 2 g  Status:  Discontinued     2 g Intravenous Every 4 hours 09/13/18 0852 09/13/18 0926   09/13/18 1000  nafcillin injection 2 g  Status:  Discontinued     2 g Intramuscular Every 4 hours 09/13/18 0926 09/13/18 0949   09/13/18 1000  nafcillin 2 g in sodium chloride 0.9 % 100 mL IVPB     2 g 200 mL/hr over 30 Minutes Intravenous Every 4 hours 09/13/18 0949 10/25/18 2359   09/13/18 0400  ceFAZolin (ANCEF) IVPB 2g/100 mL premix  Status:  Discontinued     2 g 200 mL/hr over 30 Minutes Intravenous Every 8 hours 09/12/18 2122 09/13/18 0852   09/12/18 1800  ceFAZolin (ANCEF) IVPB 2g/100 mL premix     2 g 200 mL/hr over 30 Minutes Intravenous Every 8 hours  09/12/18 1742 09/12/18 2050   09/12/18 1200  vancomycin (VANCOCIN) IVPB 1000 mg/200 mL premix     1,000 mg 200 mL/hr over 60 Minutes Intravenous  Once 09/12/18 1140 09/12/18 1700       Objective: Physical Exam: Vitals:   09/20/18 0848 09/20/18 1704 09/20/18 1958 09/21/18 0742  BP: (!) 95/58 (!) 94/59 105/71 98/74  Pulse: 72 75 70 60  Resp:  16 16 14   Temp:  98.8 F (37.1 C) 99.1 F (37.3 C) 97.9 F (36.6 C)  TempSrc:   Oral Oral  SpO2: 99%  99% 98%  Weight:      Height:        Intake/Output Summary (Last 24 hours) at 09/21/2018 1134 Last data filed at 09/21/2018 0121 Gross per 24 hour  Intake 643 ml  Output -  Net 643 ml   Filed Weights   09/12/18 1543 09/14/18 0433  Weight: 41.9 kg 46.3 kg   Gen: Debilitated, chronically ill female, AAO x3, slightly uncomfortable appearing HEENT: PERRLA, Neck supple, no JVD Lungs: Good air movement bilaterally, CTAB CVS: RRR,No Gallops,Rubs or new Murmurs Abd: soft, Non tender, mildly distended, BS present Extremities: Right shoulder with mild tenderness and painful range of motion Skin: no new rashes  Data Reviewed: CBC: Recent Labs  Lab  09/18/18 0312  WBC 4.6  HGB 8.3*  HCT 28.0*  MCV 94.0  PLT 341   Basic Metabolic Panel: Recent Labs  Lab 09/18/18 0312  NA 139  K 4.0  CL 106  CO2 25  GLUCOSE 99  BUN 18  CREATININE 0.63  CALCIUM 8.3*    Liver Function Tests: No results for input(s): AST, ALT, ALKPHOS, BILITOT, PROT, ALBUMIN in the last 168 hours. No results for input(s): LIPASE, AMYLASE in the last 168 hours. No results for input(s): AMMONIA in the last 168 hours. Coagulation Profile: No results for input(s): INR, PROTIME in the last 168 hours. Cardiac Enzymes: No results for input(s): CKTOTAL, CKMB, CKMBINDEX, TROPONINI in the last 168 hours. BNP (last 3 results) No results for input(s): PROBNP in the last 8760 hours. CBG: Recent Labs  Lab 09/15/18 1151 09/15/18 1607 09/15/18 2157 09/16/18 0755  09/17/18 1124  GLUCAP 94 101* 108* 88 155*   Studies: No results found.   Time spent: 25 minutes  Author: Zannie Cove, MD

## 2018-09-21 NOTE — Progress Notes (Signed)
Date: 09/21/2018 Patient: Mackenzie Hughes Admitted: 09/12/2018  2:10 AM Attending Provider: Zannie Cove, MD  Mackenzie Hughes has made the decision to leave [facility] against the advice of Zannie Cove, MD.  She has been informed and understands the inherent risks, including death.  She has decided to accept the responsibility for this decision. Mackenzie Hughes has been advised that she may return for further evaluation or treatment. Her condition at time of discharge was   Blood pressure 98/74, pulse 60, temperature 97.9 F (36.6 C), temperature source Oral, resp. rate 14, height 5\' 5"  (1.651 m), weight 46.3 kg, SpO2 98 %.   Patient was given education on the risks of leaving against medical advice.  Mackenzie Hughes has signed the Leaving Against Medical Advice form prior to leaving the department.  Patient has been discharged and picked up by a friend. Patient's friend is transporting her to [facility] in [place], [place].     Mackenzie Hughes 09/21/2018

## 2018-09-21 NOTE — Care Management Note (Signed)
Case Management Note  Patient Details  Name: Mackenzie Hughes MRN: [id number] Date of Birth: December 08, 1980  Subjective/Objective:     Patient left AMA.               Action/Plan: Left AMA.  Expected Discharge Date:  09/16/18               Expected Discharge Plan:  Against Medical Advice  In-House Referral:  Clinical Social Work  Discharge planning Services  CM Consult  Post Acute Care Choice:    Choice offered to:     DME Arranged:    DME Agency:     HH Arranged:    HH Agency:     Status of Service:  Completed, signed off  If discussed at [employer], dates discussed:    Additional Comments:  Leone Haven, RN 09/21/2018, 3:53 PM

## 2018-09-24 DIAGNOSIS — F1910 Other psychoactive substance abuse, uncomplicated: Secondary | ICD-10-CM

## 2018-09-24 DIAGNOSIS — E876 Hypokalemia: Secondary | ICD-10-CM

## 2018-09-24 DIAGNOSIS — M25511 Pain in right shoulder: Secondary | ICD-10-CM

## 2018-09-24 DIAGNOSIS — Z720 Tobacco use: Secondary | ICD-10-CM

## 2018-09-24 DIAGNOSIS — I38 Endocarditis, valve unspecified: Secondary | ICD-10-CM

## 2018-09-26 ENCOUNTER — Other Ambulatory Visit: Payer: Self-pay

## 2018-09-26 ENCOUNTER — Encounter (HOSPITAL_COMMUNITY): Payer: Self-pay | Admitting: Emergency Medicine

## 2018-09-26 ENCOUNTER — Emergency Department (HOSPITAL_COMMUNITY): Payer: Medicaid Other

## 2018-09-26 ENCOUNTER — Inpatient Hospital Stay (HOSPITAL_COMMUNITY)
Admission: EM | Admit: 2018-09-26 | Discharge: 2018-10-01 | DRG: 288 | Discharge status: Against Medical Advice | Payer: Medicaid Other | Attending: Family Medicine | Admitting: Family Medicine

## 2018-09-26 DIAGNOSIS — Z8679 Personal history of other diseases of the circulatory system: Secondary | ICD-10-CM

## 2018-09-26 DIAGNOSIS — K429 Umbilical hernia without obstruction or gangrene: Secondary | ICD-10-CM | POA: Diagnosis present

## 2018-09-26 DIAGNOSIS — F1123 Opioid dependence with withdrawal: Secondary | ICD-10-CM | POA: Diagnosis present

## 2018-09-26 DIAGNOSIS — R609 Edema, unspecified: Secondary | ICD-10-CM | POA: Diagnosis present

## 2018-09-26 DIAGNOSIS — Z885 Allergy status to narcotic agent status: Secondary | ICD-10-CM

## 2018-09-26 DIAGNOSIS — I059 Rheumatic mitral valve disease, unspecified: Secondary | ICD-10-CM | POA: Diagnosis present

## 2018-09-26 DIAGNOSIS — Z8543 Personal history of malignant neoplasm of ovary: Secondary | ICD-10-CM

## 2018-09-26 DIAGNOSIS — B182 Chronic viral hepatitis C: Secondary | ICD-10-CM | POA: Diagnosis present

## 2018-09-26 DIAGNOSIS — F17210 Nicotine dependence, cigarettes, uncomplicated: Secondary | ICD-10-CM | POA: Diagnosis present

## 2018-09-26 DIAGNOSIS — L89152 Pressure ulcer of sacral region, stage 2: Secondary | ICD-10-CM | POA: Diagnosis present

## 2018-09-26 DIAGNOSIS — M25511 Pain in right shoulder: Secondary | ICD-10-CM | POA: Diagnosis present

## 2018-09-26 DIAGNOSIS — Z9114 Patient's other noncompliance with medication regimen: Secondary | ICD-10-CM

## 2018-09-26 DIAGNOSIS — R7881 Bacteremia: Secondary | ICD-10-CM | POA: Diagnosis present

## 2018-09-26 DIAGNOSIS — Z681 Body mass index (BMI) 19 or less, adult: Secondary | ICD-10-CM | POA: Diagnosis not present

## 2018-09-26 DIAGNOSIS — Z8661 Personal history of infections of the central nervous system: Secondary | ICD-10-CM

## 2018-09-26 DIAGNOSIS — B9561 Methicillin susceptible Staphylococcus aureus infection as the cause of diseases classified elsewhere: Secondary | ICD-10-CM | POA: Diagnosis present

## 2018-09-26 DIAGNOSIS — Z886 Allergy status to analgesic agent status: Secondary | ICD-10-CM | POA: Diagnosis not present

## 2018-09-26 DIAGNOSIS — E43 Unspecified severe protein-calorie malnutrition: Secondary | ICD-10-CM | POA: Diagnosis present

## 2018-09-26 DIAGNOSIS — F1910 Other psychoactive substance abuse, uncomplicated: Secondary | ICD-10-CM | POA: Diagnosis present

## 2018-09-26 DIAGNOSIS — K7689 Other specified diseases of liver: Secondary | ICD-10-CM | POA: Diagnosis present

## 2018-09-26 DIAGNOSIS — I330 Acute and subacute infective endocarditis: Secondary | ICD-10-CM | POA: Diagnosis not present

## 2018-09-26 DIAGNOSIS — F419 Anxiety disorder, unspecified: Secondary | ICD-10-CM | POA: Diagnosis present

## 2018-09-26 DIAGNOSIS — F1120 Opioid dependence, uncomplicated: Secondary | ICD-10-CM | POA: Diagnosis present

## 2018-09-26 DIAGNOSIS — Z720 Tobacco use: Secondary | ICD-10-CM | POA: Diagnosis present

## 2018-09-26 DIAGNOSIS — Z91148 Patient's other noncompliance with medication regimen for other reason: Secondary | ICD-10-CM

## 2018-09-26 DIAGNOSIS — I058 Other rheumatic mitral valve diseases: Secondary | ICD-10-CM

## 2018-09-26 LAB — CBC
HCT: 29.7 % — ABNORMAL LOW (ref 36.0–46.0)
Hemoglobin: 8.7 g/dL — ABNORMAL LOW (ref 12.0–15.0)
MCH: 29.1 pg (ref 26.0–34.0)
MCHC: 29.3 g/dL — ABNORMAL LOW (ref 30.0–36.0)
MCV: 99.3 fL (ref 80.0–100.0)
Platelets: 362 10*3/uL (ref 150–400)
RBC: 2.99 MIL/uL — AB (ref 3.87–5.11)
RDW: 26.4 % — ABNORMAL HIGH (ref 11.5–15.5)
WBC: 6.9 10*3/uL (ref 4.0–10.5)
nRBC: 0 % (ref 0.0–0.2)

## 2018-09-26 LAB — BASIC METABOLIC PANEL
Anion gap: 10 (ref 5–15)
BUN: 14 mg/dL (ref 6–20)
CO2: 22 mmol/L (ref 22–32)
Calcium: 8.6 mg/dL — ABNORMAL LOW (ref 8.9–10.3)
Chloride: 110 mmol/L (ref 98–111)
Creatinine, Ser: 0.42 mg/dL — ABNORMAL LOW (ref 0.44–1.00)
GFR calc Af Amer: >60 mL/min (ref >60)
GFR calc non Af Amer: >60 mL/min (ref >60)
Glucose, Bld: 91 mg/dL (ref 70–99)
Potassium: 3.7 mmol/L (ref 3.5–5.1)
Sodium: 142 mmol/L (ref 135–145)

## 2018-09-26 LAB — CBC WITH DIFFERENTIAL/PLATELET
Abs Immature Granulocytes: 0.03 10*3/uL (ref 0.00–0.07)
BASOS PCT: 1 %
Basophils Absolute: 0.1 10*3/uL (ref 0.0–0.1)
EOS PCT: 3 %
Eosinophils Absolute: 0.2 10*3/uL (ref 0.0–0.5)
HCT: 29.6 % — ABNORMAL LOW (ref 36.0–46.0)
Hemoglobin: 8.5 g/dL — ABNORMAL LOW (ref 12.0–15.0)
Immature Granulocytes: 0 %
Lymphocytes Relative: 27 %
Lymphs Abs: 2.1 10*3/uL (ref 0.7–4.0)
MCH: 28.6 pg (ref 26.0–34.0)
MCHC: 28.7 g/dL — ABNORMAL LOW (ref 30.0–36.0)
MCV: 99.7 fL (ref 80.0–100.0)
Monocytes Absolute: 0.6 10*3/uL (ref 0.1–1.0)
Monocytes Relative: 8 %
Neutro Abs: 4.8 10*3/uL (ref 1.7–7.7)
Neutrophils Relative %: 61 %
PLATELETS: 366 10*3/uL (ref 150–400)
RBC: 2.97 MIL/uL — ABNORMAL LOW (ref 3.87–5.11)
RDW: 25.9 % — ABNORMAL HIGH (ref 11.5–15.5)
WBC: 7.8 10*3/uL (ref 4.0–10.5)
nRBC: 0 % (ref 0.0–0.2)

## 2018-09-26 LAB — I-STAT CHEM 8, ED
BUN: 18 mg/dL (ref 6–20)
CALCIUM ION: 1.19 mmol/L (ref 1.15–1.40)
Chloride: 110 mmol/L (ref 98–111)
Creatinine, Ser: 0.3 mg/dL — ABNORMAL LOW (ref 0.44–1.00)
Glucose, Bld: 88 mg/dL (ref 70–99)
HCT: 31 % — ABNORMAL LOW (ref 36.0–46.0)
Hemoglobin: 10.5 g/dL — ABNORMAL LOW (ref 12.0–15.0)
Potassium: 3.7 mmol/L (ref 3.5–5.1)
Sodium: 142 mmol/L (ref 135–145)
TCO2: 26 mmol/L (ref 22–32)

## 2018-09-26 LAB — CREATININE, SERUM
Creatinine, Ser: 0.67 mg/dL (ref 0.44–1.00)
GFR calc Af Amer: >60 mL/min (ref >60)
GFR calc non Af Amer: >60 mL/min (ref >60)

## 2018-09-26 LAB — I-STAT TROPONIN, ED: Troponin i, poc: 0 ng/mL (ref 0.00–0.08)

## 2018-09-26 MED ORDER — IOPAMIDOL (ISOVUE-370) INJECTION 76%
75.0000 mL | Freq: Once | INTRAVENOUS | Status: AC | PRN
  Administered 2018-09-26: 75 mL via INTRAVENOUS

## 2018-09-26 MED ORDER — TRAZODONE HCL 50 MG PO TABS
50.0000 mg | ORAL_TABLET | Freq: Every day | ORAL | Status: DC
  Administered 2018-09-26 – 2018-09-30 (×5): 50 mg via ORAL
  Filled 2018-09-26 (×5): qty 1

## 2018-09-26 MED ORDER — SODIUM CHLORIDE 0.9 % IV SOLN
INTRAVENOUS | Status: DC | PRN
  Administered 2018-09-26 – 2018-09-29 (×3): 1000 mL via INTRAVENOUS

## 2018-09-26 MED ORDER — NAFCILLIN SODIUM 2 G IJ SOLR
2.0000 g | INTRAMUSCULAR | Status: DC
  Filled 2018-09-26 (×2): qty 2000

## 2018-09-26 MED ORDER — SODIUM CHLORIDE 0.9 % IV BOLUS
1000.0000 mL | Freq: Once | INTRAVENOUS | Status: AC
  Administered 2018-09-26: 1000 mL via INTRAVENOUS

## 2018-09-26 MED ORDER — SODIUM CHLORIDE 0.9 % IV SOLN: 2.0000 g | Freq: Four times a day (QID) | INTRAVENOUS | Status: DC

## 2018-09-26 MED ORDER — SODIUM CHLORIDE 0.9% FLUSH: 10.0000 mL | INTRAVENOUS | Status: DC | PRN

## 2018-09-26 MED ORDER — SODIUM CHLORIDE 0.9 % IV SOLN
2.0000 g | Freq: Once | INTRAVENOUS | Status: AC
  Administered 2018-09-26: 2 g via INTRAVENOUS
  Filled 2018-09-26: qty 2000

## 2018-09-26 MED ORDER — KETAMINE HCL 50 MG/5ML IJ SOSY: 10.0000 mg | PREFILLED_SYRINGE | Freq: Once | INTRAMUSCULAR | Status: DC

## 2018-09-26 MED ORDER — ENOXAPARIN SODIUM 40 MG/0.4ML ~~LOC~~ SOLN
40.0000 mg | Freq: Every day | SUBCUTANEOUS | Status: DC
  Administered 2018-09-27: 40 mg via SUBCUTANEOUS
  Filled 2018-09-26 (×3): qty 0.4

## 2018-09-26 MED ORDER — DIPHENHYDRAMINE HCL 25 MG PO CAPS: 25.0000 mg | ORAL_CAPSULE | Freq: Four times a day (QID) | ORAL | Status: DC | PRN

## 2018-09-26 MED ORDER — LIDOCAINE 5 % EX PTCH
1.0000 | MEDICATED_PATCH | Freq: Every day | CUTANEOUS | Status: DC
  Administered 2018-09-27: 1 via TRANSDERMAL
  Filled 2018-09-26 (×2): qty 1

## 2018-09-26 MED ORDER — SODIUM CHLORIDE 0.9 % IV SOLN
2.0000 g | INTRAVENOUS | Status: DC
  Administered 2018-09-26 – 2018-10-01 (×26): 2 g via INTRAVENOUS
  Filled 2018-09-26 (×32): qty 2000

## 2018-09-26 MED ORDER — FAMOTIDINE 20 MG PO TABS
20.0000 mg | ORAL_TABLET | Freq: Two times a day (BID) | ORAL | Status: DC
  Administered 2018-09-26 – 2018-09-30 (×9): 20 mg via ORAL
  Filled 2018-09-26 (×9): qty 1

## 2018-09-26 NOTE — ED Notes (Signed)
Patient transported to CT 

## 2018-09-26 NOTE — ED Notes (Signed)
IV team here to get IV in ac for CT angio I got her 2nd culture but IV would not thread

## 2018-09-26 NOTE — ED Triage Notes (Addendum)
Pt here for palpitations hx of endocardiatits and left AMA  Last  Couple of timeslast used heroin yesterday

## 2018-09-26 NOTE — H&P (Addendum)
History and Physical  Mackenzie HoldingCarla T Hughes ZOX:[id number] DOB: 12-14-1980 DOA: 09/26/2018  Referring physician: Melene Hughes, Dan, DO     PCP: Mackenzie Hughes  Outpatient Specialists:  Patient coming from: Home & is able to ambulate   Chief Complaint: Bacterial endocarditis  HPI: Mackenzie Hughes is a 38 y.o. female with medical history significant for intravenous drug use, hepatitis C, remote ovarian cancer, WERNICKE'S encephalopathy  on July 2019, MRSA of mitral valve endocarditis, [facility]  Her blood culture was still positive as of December 17,019 and she had a septic emboli as well as cerebral abscess and renal and splenic infarctions she was admitted again [facility] on September 07, 2018 and had UDS positive for amphetamine and opiate at that time.  She was recently admitted 3 days ago at [facility] and left AGAINST MEDICAL ADVICE because she did not get IV narcotic.  She was supposed to have stayed until October 25, 2018 for IV antibiotics for her endocarditis but she left AMA.  She returns to the emergency room this afternoon because of her heart rate that is increased as well as feeling hot and cold.  She is complaining of right shoulder pain she denies any injury.  She denies any chest pain or shortness of breath.    ED Course: Blood culture obtained, started on nafcillin IV antibiotics  Review of Systems: Patient seen same. Pt complains of right shoulder pain  Pt denies any nausea vomiting no dizziness or shortness of breath.  Denies any injury or fall review of systems are otherwise negative   Past Medical History:  Diagnosis Date  . Endocarditis   . Hepatitis C infection   . IV drug abuse (HCC)   . Myositis   . Ovarian cancer in remission   . Wernicke encephalopathy    Past Surgical History:  Procedure Laterality Date  . TONSILLECTOMY    . TUBAL  LIGATION      Social History:  reports that she has been smoking cigarettes. She has been smoking about 0.50 packs Hughes day. She has never used smokeless tobacco. She reports current drug use. Drugs: IV and Heroin. She reports that she does not drink alcohol.   Allergies  Allergen Reactions  . Toradol [Ketorolac Tromethamine] Other (See Comments)    GI upset  . Tramadol Other (See Comments)    GI upset    No family history on file.    Prior to Admission medications   Not on File    Physical Exam: BP 100/72 (BP Location: Left Arm)   Pulse 61   Temp 98.4 F (36.9 C) (Oral)   Resp 15   SpO2 100%   Exam:  . General: 38 y.o. year-old female well developed well nourished in no acute distress.  Alert and oriented x3.  Chronically ill looking cachectic . Cardiovascular: Regular rate and rhythm with 5/6 systolic ejection murmur.  No thyromegaly or JVD noted.   Marland Kitchen. Respiratory: Clear to auscultation with no wheezes or rales. Good inspiratory effort. . Abdomen: Soft nontender nondistended with normal bowel sounds x4 quadrants. . Musculoskeletal: No lower extremity edema. 2/4 pulses in all 4 extremities..  Tender to the right shoulder to even very light touch by the examiner but patient was able to hold it much firmer without any pain.  She also refused for range of motion exam because she  stated that it hurts and did not give her any medication for it also moreover that the other doctor had already done the same exam and it was hurting really bad . Skin: No ulcerative lesions noted or rashes, multiple small puncture wounds on her arms . Psychiatry: Mood is appropriate for condition and setting           Labs on Admission:  Basic Metabolic Panel: Recent Labs  Lab 09/26/18 1407 09/26/18 1428  NA 142 142  K 3.7 3.7  CL 110 110  CO2 22  --   GLUCOSE 91 88  BUN 14 18  CREATININE 0.42* 0.30*  CALCIUM 8.6*  --    Liver Function Tests: No results for input(s): AST, ALT, ALKPHOS,  BILITOT, PROT, ALBUMIN in the last 168 hours. No results for input(s): LIPASE, AMYLASE in the last 168 hours. No results for input(s): AMMONIA in the last 168 hours. CBC: Recent Labs  Lab 09/26/18 1406 09/26/18 1428  WBC 7.8  --   NEUTROABS 4.8  --   HGB 8.5* 10.5*  HCT 29.6* 31.0*  MCV 99.7  --   PLT 366  --    Cardiac Enzymes: No results for input(s): CKTOTAL, CKMB, CKMBINDEX, TROPONINI in the last 168 hours.  BNP (last 3 results) No results for input(s): BNP in the last 8760 hours.  ProBNP (last 3 results) No results for input(s): PROBNP in the last 8760 hours.  CBG: No results for input(s): GLUCAP in the last 168 hours.  Radiological Exams on Admission: Ct Angio Chest Pe W And/or Wo Contrast  Result Date: 09/26/2018 CLINICAL DATA:  Chest palpitations and right shoulder pain in an IV drug abuser. History of endocarditis. EXAM: CT ANGIOGRAPHY CHEST WITH CONTRAST TECHNIQUE: Multidetector CT imaging of the chest was performed using the standard protocol during bolus administration of intravenous contrast. Multiplanar CT image reconstructions and MIPs were obtained to evaluate the vascular anatomy. CONTRAST:  75 mL ISOVUE-370 IOPAMIDOL (ISOVUE-370) INJECTION 76% COMPARISON:  CT chest 07/26/2018. Single view of the chest 09/23/2018 and 09/24/2018. FINDINGS: Cardiovascular: No pulmonary embolus is identified. There is cardiomegaly. No pericardial effusion. No aneurysm. No atherosclerotic vascular disease identified. Mediastinum/Nodes: No enlarged mediastinal, hilar, or axillary lymph nodes. Thyroid gland, trachea, and esophagus demonstrate no significant findings. Lungs/Pleura: Very small right pleural effusion is seen. Focal airspace disease is seen in the periphery of the right middle lobe. Peribronchial thickening is noted. Dependent atelectasis is seen. There is some emphysematous disease. Upper Abdomen: Negative. Musculoskeletal: Negative. Review of the MIP images confirms the above  findings. IMPRESSION: Negative for pulmonary embolus. Focal airspace disease in the periphery of the right middle lobe has an appearance most worrisome for pneumonia. Peribronchial thickening compatible with bronchitis also noted. Marked cardiomegaly. Emphysema (ICD10-J43.9). Electronically Signed   By: Drusilla Kanner M.D.   On: 09/26/2018 16:21    EKG: Independently reviewed. NONE   Assessment/Plan Present on Admission: . Bacteremia due to methicillin susceptible Staphylococcus aureus (MSSA) . Chronic hepatitis C without hepatic coma (HCC) . Endocarditis of mitral valve . Tobacco user . IV drug abuse (HCC) . (Resolved) Acute infective endocarditis . Protein-calorie malnutrition, severe . Opiate dependence (HCC) . Acute and subacute bacterial endocarditis  Active Problems:   Bacteremia due to methicillin susceptible Staphylococcus aureus (MSSA)   Chronic hepatitis C without hepatic coma (HCC)   Endocarditis of mitral valve   Tobacco user   Opiate dependence (HCC)   IV drug abuse (HCC)   Protein-calorie malnutrition, severe   Noncompliance  with medication regimen   Acute and subacute bacterial endocarditis   1.  Bacterial endocarditis patient noncompliant with medication patient will be started on IV nafcillin [facility]  2.  Right shoulder pain x-ray does not reveal any pathology local anesthetic lidocaine has been prescribed  3.  Current IV drug user not ready to quit  4.  History of noncompliance due to her IV drug use  5.  Protein energy malnutrition we will continue normal calorie of high-calorie diet  Severity of Illness: The appropriate patient status for this patient is INPATIENT. Inpatient status is judged to be reasonable and necessary in order to provide the required intensity of service to ensure the patient's safety. The patient's presenting symptoms, physical exam findings, and initial radiographic and laboratory data in the context of their chronic  comorbidities is felt to place them at high risk for further clinical deterioration. Furthermore, it is not anticipated that the patient will be medically stable for discharge from the hospital within 2 midnights of admission. The following factors support the patient status of inpatient.   " The patient's presenting symptoms include heart flashes chills. " The worrisome physical exam findings include tenderness shoulder. " The initial radiographic and laboratory data are worrisome because of bacterial endocarditis. " The chronic co-morbidities include bacterial endocarditis with IV current IV drug user.   * I certify that at the point of admission it is my clinical judgment that the patient will require inpatient hospital care spanning beyond 2 midnights from the point of admission due to high intensity of service, high risk for further deterioration and high frequency of surveillance required.*    DVT prophylaxis: Lovenox  Code Status: Full  Family Communication: None  Disposition Plan: Home when completed antibiotics  Consults called: None  Admission status: Inpatient  Addendum: Got a text from pharmacy tech the patient is requesting Suboxone which she usually gets when she comes into the hospital but she does not usually take it outside the hospital.  She is requesting this because she says she is in withdrawal.  I will start her on Pepcid p.o. diphenhydramine p.o. and trazodone p.o. to help her with her withdrawal symptoms  Myrtie NeitherNwannadiya Noelle Sease MD [facility] Pager [phone]  If 7PM-7AM, please contact night-coverage [email or web address] Password [email or web address]  09/26/2018, 6:10 PM

## 2018-09-26 NOTE — Progress Notes (Signed)
Pharmacy Antibiotic Note  Mackenzie Hughes is a 38 y.o. female admitted on 09/26/2018 with MSSA endocarditis.  Pharmacy has been consulted for Nafcillin dosing.  Pt recently here and being treated for MSSA endocarditis but left AMA on 1/15. Plan was for IV Nafcillin for 6 weeks per ID recs - last date of antibiotics was planned to be 2/18.  Plan: Nafcillin 2gm IV q4h. Will f/u renal function and pt's clinical condition     Temp (24hrs), Avg:98.4 F (36.9 C), Min:98.4 F (36.9 C), Max:98.4 F (36.9 C)  Recent Labs  Lab 09/26/18 1406 09/26/18 1407 09/26/18 1428  WBC 7.8  --   --   CREATININE  --  0.42* 0.30*    Estimated Creatinine Clearance: 71.7 mL/min (A) (by C-G formula based on SCr of 0.3 mg/dL (L)).    Allergies  Allergen Reactions  . Toradol [Ketorolac Tromethamine] Other (See Comments)    GI upset  . Tramadol Other (See Comments)    GI upset    Antimicrobials this admission: 1/20 Nafcillin >> plan 2/18 per ID recs  Microbiology results: Pending  Thank you for allowing pharmacy to be a part of this patient's care.  Christoper Fabian, PharmD, BCPS Clinical pharmacist  **Pharmacist phone directory can now be found on [email or web address] (PW [email or web address]).  Listed under [facility]. 09/26/2018 6:11 PM

## 2018-09-26 NOTE — ED Provider Notes (Signed)
[facility] EMERGENCY DEPARTMENT Provider Note   CSN: [id number] Arrival date & time: 09/26/18  1222     History   Chief Complaint Chief Complaint  Patient presents with  . Palpitations    HPI Mackenzie Hughes is a 38 y.o. female.  38 yo F with a chief complaint of subacute bacterial endocarditis.  She was supposed to be hospitalized at this facility until 10/25/2018 when she would have finished a course of nafcillin.  The patient continues to do IV drugs and has been admitted multiple times for the same and continues to leave AGAINST MEDICAL ADVICE.  Was recently she was admitted 3 days ago at [facility], she did not receive IV narcotics and decided to leave.  She was continuing to have her heart rates on her from time to time as well to feel very hot and cold.  She made a decision to come back to the ED today due to this.  The history is provided by the patient.  Palpitations  Associated symptoms: weakness   Associated symptoms: no chest pain, no dizziness, no nausea, no shortness of breath and no vomiting   Illness  Severity:  Moderate Onset quality:  Sudden Duration:  2 days Timing:  Constant Progression:  Worsening Chronicity:  New Associated symptoms: fever (subjective)   Associated symptoms: no chest pain, no congestion, no headaches, no myalgias, no nausea, no rhinorrhea, no shortness of breath, no vomiting and no wheezing     Past Medical History:  Diagnosis Date  . Endocarditis   . Hepatitis C infection   . IV drug abuse (HCC)   . Myositis   . Ovarian cancer in remission   . Wernicke encephalopathy     Patient Active Problem List   Diagnosis Date Noted  . Noncompliance with medication regimen 09/26/2018  . Acute and subacute bacterial endocarditis 09/26/2018  . Cerebritis 09/13/2018  . Protein-calorie malnutrition, severe 09/13/2018  . Opiate dependence (HCC) 09/12/2018  . IV drug abuse (HCC)   . Myositis   . Pressure  injury of skin 03/07/2018  . Tobacco user 03/07/2018  . Bacteremia due to methicillin susceptible Staphylococcus aureus (MSSA) 03/06/2018  . Anemia 03/06/2018  . Chronic hepatitis C without hepatic coma (HCC) 03/06/2018  . Endocarditis of mitral valve 03/06/2018    Past Surgical History:  Procedure Laterality Date  . TONSILLECTOMY    . TUBAL LIGATION       OB History   No obstetric history on file.      Home Medications    Prior to Admission medications   Not on File    Family History No family history on file.  Social History Social History   Tobacco Use  . Smoking status: Heavy Tobacco Smoker    Packs/day: 0.50    Types: Cigarettes  . Smokeless tobacco: Never Used  . Tobacco comment: needs a patch  Substance Use Topics  . Alcohol use: No  . Drug use: Yes    Types: IV, Heroin    Comment: heroin     Allergies   Patient has no known allergies.   Review of Systems Review of Systems  Constitutional: Positive for chills and fever (subjective).  HENT: Negative for congestion and rhinorrhea.   Eyes: Negative for redness and visual disturbance.  Respiratory: Negative for shortness of breath and wheezing.   Cardiovascular: Positive for palpitations. Negative for chest pain.  Gastrointestinal: Negative for nausea and vomiting.  Genitourinary: Negative for dysuria and urgency.  Musculoskeletal: Negative for arthralgias and myalgias.  Skin: Negative for pallor and wound.  Neurological: Positive for weakness. Negative for dizziness and headaches.     Physical Exam Updated Vital Signs BP 97/80 (BP Location: Right Arm)   Pulse 66   Temp 98 F (36.7 C) (Oral)   Resp 18   Ht 5\' 5"  (1.651 m)   Wt 45.4 kg Comment: PATIENT REFUSES STANDING WEIGHT  SpO2 98%   BMI 16.66 kg/m   Physical Exam Vitals signs and nursing note reviewed.  Constitutional:      General: She is not in acute distress.    Appearance: She is well-developed. She is not diaphoretic.    HENT:     Head: Normocephalic and atraumatic.  Eyes:     Pupils: Pupils are equal, round, and reactive to light.  Neck:     Musculoskeletal: Normal range of motion and neck supple.     Comments: TTP about the right trapezius muscle, mild swelling Cardiovascular:     Rate and Rhythm: Normal rate and regular rhythm.     Heart sounds: Murmur ( 5 out of 6 systolic ejection murmur at the mitral pole) present. No friction rub. No gallop.   Pulmonary:     Effort: Pulmonary effort is normal.     Breath sounds: No wheezing or rales.  Abdominal:     General: There is no distension.     Palpations: Abdomen is soft.     Tenderness: There is no abdominal tenderness.  Musculoskeletal:        General: No tenderness.  Skin:    General: Skin is warm and dry.  Neurological:     Mental Status: She is alert and oriented to person, place, and time.  Psychiatric:        Behavior: Behavior normal.      ED Treatments / Results  Labs (all labs ordered are listed, but only abnormal results are displayed) Labs Reviewed  CBC WITH DIFFERENTIAL/PLATELET - Abnormal; Notable for the following components:      Result Value   RBC 2.97 (*)    Hemoglobin 8.5 (*)    HCT 29.6 (*)    MCHC 28.7 (*)    RDW 25.9 (*)    All other components within normal limits  BASIC METABOLIC PANEL - Abnormal; Notable for the following components:   Creatinine, Ser 0.42 (*)    Calcium 8.6 (*)    All other components within normal limits  CBC - Abnormal; Notable for the following components:   RBC 2.99 (*)    Hemoglobin 8.7 (*)    HCT 29.7 (*)    MCHC 29.3 (*)    RDW 26.4 (*)    All other components within normal limits  I-STAT CHEM 8, ED - Abnormal; Notable for the following components:   Creatinine, Ser 0.30 (*)    Hemoglobin 10.5 (*)    HCT 31.0 (*)    All other components within normal limits  CULTURE, BLOOD (ROUTINE X 2)  CULTURE, BLOOD (ROUTINE X 2)  CREATININE, SERUM  HIV ANTIBODY (ROUTINE TESTING W  REFLEX)  I-STAT TROPONIN, ED    EKG EKG Interpretation  Date/Time:  Monday September 26 2018 12:30:05 EST Ventricular Rate:  73 PR Interval:    QRS Duration: 79 QT Interval:  484 QTC Calculation: 534 R Axis:   66 Text Interpretation:  Sinus arrhythmia Biatrial enlargement Left ventricular hypertrophy Probable anterior infarct, age indeterminate Prolonged QT interval No significant change since last tracing Confirmed by  Melene Plan [phone]) on 09/26/2018 1:19:18 PM   Radiology Ct Angio Chest Pe W And/or Wo Contrast  Result Date: 09/26/2018 CLINICAL DATA:  Chest palpitations and right shoulder pain in an IV drug abuser. History of endocarditis. EXAM: CT ANGIOGRAPHY CHEST WITH CONTRAST TECHNIQUE: Multidetector CT imaging of the chest was performed using the standard protocol during bolus administration of intravenous contrast. Multiplanar CT image reconstructions and MIPs were obtained to evaluate the vascular anatomy. CONTRAST:  75 mL ISOVUE-370 IOPAMIDOL (ISOVUE-370) INJECTION 76% COMPARISON:  CT chest 07/26/2018. Single view of the chest 09/23/2018 and 09/24/2018. FINDINGS: Cardiovascular: No pulmonary embolus is identified. There is cardiomegaly. No pericardial effusion. No aneurysm. No atherosclerotic vascular disease identified. Mediastinum/Nodes: No enlarged mediastinal, hilar, or axillary lymph nodes. Thyroid gland, trachea, and esophagus demonstrate no significant findings. Lungs/Pleura: Very small right pleural effusion is seen. Focal airspace disease is seen in the periphery of the right middle lobe. Peribronchial thickening is noted. Dependent atelectasis is seen. There is some emphysematous disease. Upper Abdomen: Negative. Musculoskeletal: Negative. Review of the MIP images confirms the above findings. IMPRESSION: Negative for pulmonary embolus. Focal airspace disease in the periphery of the right middle lobe has an appearance most worrisome for pneumonia. Peribronchial thickening  compatible with bronchitis also noted. Marked cardiomegaly. Emphysema (ICD10-J43.9). Electronically Signed   By: Drusilla Kanner M.D.   On: 09/26/2018 16:21    Procedures Procedures (including critical care time)  Medications Ordered in ED Medications  sodium chloride flush (NS) 0.9 % injection 10-40 mL (has no administration in time range)  lidocaine (LIDODERM) 5 % 1 patch (1 patch Transdermal Not Given 09/26/18 2225)  enoxaparin (LOVENOX) injection 40 mg (40 mg Subcutaneous Not Given 09/26/18 2225)  nafcillin 2 g in sodium chloride 0.9 % 100 mL IVPB (2 g Intravenous New Bag/Given 09/27/18 0538)  traZODone (DESYREL) tablet 50 mg (50 mg Oral Given 09/26/18 2225)  diphenhydrAMINE (BENADRYL) capsule 25 mg (has no administration in time range)  famotidine (PEPCID) tablet 20 mg (20 mg Oral Given 09/26/18 2225)  0.9 %  sodium chloride infusion (1,000 mLs Intravenous New Bag/Given 09/26/18 2221)  sodium chloride 0.9 % bolus 1,000 mL (0 mLs Intravenous Stopped 09/26/18 1753)  nafcillin 2 g in sodium chloride 0.9 % 100 mL IVPB (0 g Intravenous Stopped 09/26/18 1754)  iopamidol (ISOVUE-370) 76 % injection 75 mL (75 mLs Intravenous Contrast Given 09/26/18 1603)  ketorolac (TORADOL) 30 MG/ML injection 30 mg (30 mg Intravenous Given 09/27/18 0243)     Initial Impression / Assessment and Plan / ED Course  I have reviewed the triage vital signs and the nursing notes.  Pertinent labs & imaging results that were available during my care of the patient were reviewed by me and considered in my medical decision making (see chart for details).     38 yo F with a chief complaint of her heart racing having fevers and chills and having pain to the right trapezius area.  Patient left AGAINST MEDICAL ADVICE 3 days ago from the hospital in [place].  She is supposed to be on nafcillin, will give a dose in the ED here.  Patient now states that she would like to stay, will obtain a CT of the chest to evaluate for  possible pathology in the right trapezius, records were reviewed and she has had an MRI to the right shoulder which was read as negative.  The work-up so far has been mostly unremarkable, creatinine appears to be at baseline, anemia at baseline.  No significant  electrolyte abnormality.  Awaiting a CT angiogram of the chest, patient care signed out to Dr. Clarice Pole, please see her note for further details care in ED.  The patients results and plan were reviewed and discussed.   Any x-rays performed were independently reviewed by myself.   Differential diagnosis were considered with the presenting HPI.  Medications  sodium chloride flush (NS) 0.9 % injection 10-40 mL (has no administration in time range)  lidocaine (LIDODERM) 5 % 1 patch (1 patch Transdermal Not Given 09/26/18 2225)  enoxaparin (LOVENOX) injection 40 mg (40 mg Subcutaneous Not Given 09/26/18 2225)  nafcillin 2 g in sodium chloride 0.9 % 100 mL IVPB (2 g Intravenous New Bag/Given 09/27/18 0538)  traZODone (DESYREL) tablet 50 mg (50 mg Oral Given 09/26/18 2225)  diphenhydrAMINE (BENADRYL) capsule 25 mg (has no administration in time range)  famotidine (PEPCID) tablet 20 mg (20 mg Oral Given 09/26/18 2225)  0.9 %  sodium chloride infusion (1,000 mLs Intravenous New Bag/Given 09/26/18 2221)  sodium chloride 0.9 % bolus 1,000 mL (0 mLs Intravenous Stopped 09/26/18 1753)  nafcillin 2 g in sodium chloride 0.9 % 100 mL IVPB (0 g Intravenous Stopped 09/26/18 1754)  iopamidol (ISOVUE-370) 76 % injection 75 mL (75 mLs Intravenous Contrast Given 09/26/18 1603)  ketorolac (TORADOL) 30 MG/ML injection 30 mg (30 mg Intravenous Given 09/27/18 0243)    Vitals:   09/26/18 1236 09/26/18 2110 09/27/18 0350  BP: 100/72 (!) 132/42 97/80  Pulse: 61 73 66  Resp: 15 20 18   Temp: 98.4 F (36.9 C) 98.2 F (36.8 C) 98 F (36.7 C)  TempSrc: Oral Oral Oral  SpO2: 100% 100% 98%  Weight:   45.4 kg  Height:   5\' 5"  (1.651 m)    Final diagnoses:  Acute  bacterial endocarditis    Admission/ observation were discussed with the admitting physician, patient and/or family and they are comfortable with the plan.    Final Clinical Impressions(s) / ED Diagnoses   Final diagnoses:  Acute bacterial endocarditis    ED Discharge Orders    None       Melene Plan, DO 09/27/18 [id number]

## 2018-09-27 ENCOUNTER — Inpatient Hospital Stay (HOSPITAL_COMMUNITY): Payer: Medicaid Other

## 2018-09-27 DIAGNOSIS — I330 Acute and subacute infective endocarditis: Principal | ICD-10-CM

## 2018-09-27 DIAGNOSIS — F17210 Nicotine dependence, cigarettes, uncomplicated: Secondary | ICD-10-CM

## 2018-09-27 DIAGNOSIS — R011 Cardiac murmur, unspecified: Secondary | ICD-10-CM

## 2018-09-27 DIAGNOSIS — R51 Headache: Secondary | ICD-10-CM

## 2018-09-27 DIAGNOSIS — M25511 Pain in right shoulder: Secondary | ICD-10-CM

## 2018-09-27 DIAGNOSIS — B182 Chronic viral hepatitis C: Secondary | ICD-10-CM

## 2018-09-27 DIAGNOSIS — B9561 Methicillin susceptible Staphylococcus aureus infection as the cause of diseases classified elsewhere: Secondary | ICD-10-CM

## 2018-09-27 DIAGNOSIS — E43 Unspecified severe protein-calorie malnutrition: Secondary | ICD-10-CM

## 2018-09-27 DIAGNOSIS — M542 Cervicalgia: Secondary | ICD-10-CM

## 2018-09-27 DIAGNOSIS — I361 Nonrheumatic tricuspid (valve) insufficiency: Secondary | ICD-10-CM

## 2018-09-27 DIAGNOSIS — F1120 Opioid dependence, uncomplicated: Secondary | ICD-10-CM

## 2018-09-27 DIAGNOSIS — I340 Nonrheumatic mitral (valve) insufficiency: Secondary | ICD-10-CM

## 2018-09-27 DIAGNOSIS — Z9114 Patient's other noncompliance with medication regimen: Secondary | ICD-10-CM

## 2018-09-27 LAB — HIV ANTIBODY (ROUTINE TESTING W REFLEX): HIV Screen 4th Generation wRfx: NONREACTIVE

## 2018-09-27 LAB — ECHOCARDIOGRAM COMPLETE
Height: 65 in
Weight: 1601.42 oz

## 2018-09-27 MED ORDER — ADULT MULTIVITAMIN W/MINERALS CH
1.0000 | ORAL_TABLET | Freq: Every day | ORAL | Status: DC
  Administered 2018-09-28 – 2018-09-30 (×3): 1 via ORAL
  Filled 2018-09-27 (×3): qty 1

## 2018-09-27 MED ORDER — BUPRENORPHINE HCL-NALOXONE HCL 8-2 MG SL SUBL
1.0000 | SUBLINGUAL_TABLET | Freq: Every day | SUBLINGUAL | Status: DC
  Administered 2018-09-27 – 2018-09-30 (×4): 1 via SUBLINGUAL
  Filled 2018-09-27 (×4): qty 1

## 2018-09-27 MED ORDER — ORITAVANCIN DIPHOSPHATE 400 MG IV SOLR
1200.0000 mg | Freq: Once | INTRAVENOUS | Status: AC
  Administered 2018-09-27: 1200 mg via INTRAVENOUS
  Filled 2018-09-27: qty 120

## 2018-09-27 MED ORDER — KETOROLAC TROMETHAMINE 30 MG/ML IJ SOLN
30.0000 mg | Freq: Once | INTRAMUSCULAR | Status: AC
  Administered 2018-09-27: 30 mg via INTRAVENOUS
  Filled 2018-09-27: qty 1

## 2018-09-27 MED ORDER — ENSURE ENLIVE PO LIQD
237.0000 mL | Freq: Three times a day (TID) | ORAL | Status: DC
  Administered 2018-09-27 – 2018-09-30 (×7): 237 mL via ORAL

## 2018-09-27 NOTE — Consult Note (Signed)
[facility]    Date of Admission:  09/26/2018     Total days of antibiotics 2 Nafcillin        Reason for Consult: MSSA MV endocarditis, pyomyositis, septic emboli    Referring Provider: Alanda SlimGonfa  Primary Care Provider: Patient, No Pcp Per   Assessment: Mackenzie Hughes is is back again for treatment with IV antibiotics for her MSSA infection. Fortunately despite consistently interrupted therapy she has maintained sterile blood cultures; ones drawn this admission are negative @ 24h and previously on 1/06 they were negative, final. I would not place picc line at this point for her until we see what she will do with regards to treatment. The primary reason she leaves is inability to manage pain while here. ?if toradol would be helpful +/- long acting baseline opioid to meet opioid debt with her history of dependence. I told her that she needs to get herself back into a treatment program outpatient.   She is afebrile, stable and w/o signs of sepsis presently. Will arrange for Oritavancin long acting infusion x 1 now and continue nafcillin. She is a high risk of leaving before she is ready. This will allow for 14 days of antibiotics in her system in hopes to continue treatment of her infections. Repeated TTE noted with preserved EF and severe MR and mobile loblated mass on the posterior leaflet of the mitral valve. High risk for embolization. She is complaining of mild headaches for now - would have very low threshold to scan brain looking for emboli / mycotic aneurysm.   Chronic hep c - management outpatient if she agrees.   Could consider arranging follow up with ID clinic outpatient for repeat infusion in 10 days and discharge with [facility] and attempt to make this more controlled setting for her. High risk for overdose with acute pain and no consistent use in 6 months.    Plan: 1. oritavancin x 1 as above - appreciate our pharmacy  team's assistance  2. Continue nafcillin for now with history of MSSA infection 3. Would continue treatment as long as she will stay 4. Follow micro data 5. Consider brain imaging to r/o further embolization to brain or conversion to mycotic aneurysm with c/o headaches.    Active Problems:   Bacteremia due to methicillin susceptible Staphylococcus aureus (MSSA)   Chronic hepatitis C without hepatic coma (HCC)   Endocarditis of mitral valve   Tobacco user   Opiate dependence (HCC)   IV drug abuse (HCC)   Protein-calorie malnutrition, severe   Noncompliance with medication regimen   Acute and subacute bacterial endocarditis   . buprenorphine-naloxone  1 tablet Sublingual Daily  . enoxaparin (LOVENOX) injection  40 mg Subcutaneous QHS  . famotidine  20 mg Oral BID  . lidocaine  1 patch Transdermal QHS  . traZODone  50 mg Oral QHS    HPI: Mackenzie Hughes is a 38 y.o. 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   Most recently admitted and seen by our team January 9th - recommended IV nafcillin through 2/18 to treat MSSA bacteremia complicated by cerebritis, left calf myositis, splenic and renal infarcts, MV endocarditis - left prior to treatment completion on 1/15.   [facility] with same constellation of partially treated  infectious sequelae related to disseminated MSSA infection. Treated with oxacillin until she left later that night. Left because she is unable to have pain maintained.   She is crying throughout visit and tells me she wants to get better. Has been on suboxone in the past and would like to use this again (I think she is getting this now while INP). She has had subjective unmeasured fevers/chills, lower extremity weakness and swelling, Right shoulder/neck pain, chest palpitations and shortness  of breath. Reports her legs look better today but previously with "large nodules" overlying shins. She keeps coming back to the hospital because she hopes she will find someone that will help allow her to be "more comfortable" while she undergoes prolonged treatment IV. She tells me she wants to stay now.   Review of Systems  Constitutional: Positive for chills, fever (subjective) and malaise/fatigue.  HENT: Negative for sore throat.   Respiratory: Positive for shortness of breath. Negative for cough.   Cardiovascular: Positive for chest pain and leg swelling.  Gastrointestinal: Negative for abdominal pain, diarrhea and vomiting.  Genitourinary: Negative for dysuria.  Musculoskeletal: Positive for joint pain (R shoulder) and neck pain. Negative for myalgias.  Neurological: Positive for headaches. Negative for dizziness and focal weakness.    Past Medical History:  Diagnosis Date  . Endocarditis   . Hepatitis C infection   . IV drug abuse (HCC)   . Myositis   . Ovarian cancer in remission   . Wernicke encephalopathy     Social History   Tobacco Use  . Smoking status: Heavy Tobacco Smoker    Packs/day: 0.50    Types: Cigarettes  . Smokeless tobacco: Never Used  . Tobacco comment: needs a patch  Substance Use Topics  . Alcohol use: No  . Drug use: Yes    Types: IV, Heroin    Comment: heroin    No family history on file. No Known Allergies  OBJECTIVE: Blood pressure 130/78, pulse 70, temperature 98.1 F (36.7 C), temperature source Oral, resp. rate 20, height 5\' 5"  (1.651 m), weight 45.4 kg, SpO2 98 %.  Physical Exam Constitutional:      Appearance: She is ill-appearing. She is not toxic-appearing.     Comments: Alert most of the time but falls forward seems to be asleep on occasions but wakes easily.  Malnourished and underweight  HENT:     Mouth/Throat:     Mouth: Mucous membranes are moist.     Pharynx: Oropharynx is clear. No oropharyngeal exudate.  Eyes:      General: No scleral icterus.    Pupils: Pupils are equal, round, and reactive to light.  Neck:     Musculoskeletal: Muscular tenderness (R sternocleidomastoid muscle tenderness) present. No neck rigidity.  Cardiovascular:     Rate and Rhythm: Normal rate and regular rhythm.     Pulses: Normal pulses.     Heart sounds: Murmur (loud holosystolic murmur LSB 3/6) present.  Pulmonary:     Effort: Pulmonary effort is normal.     Breath sounds: Normal breath sounds.  Abdominal:     General: There is no distension.     Palpations: Abdomen is soft.     Tenderness: There is no abdominal tenderness.  Musculoskeletal:        General: No swelling or tenderness.     Right lower leg: No edema.     Left lower leg: No edema.  Lymphadenopathy:     Cervical: No cervical adenopathy.  Skin:  General: Skin is warm and dry.     Capillary Refill: Capillary refill takes less than 2 seconds.  Neurological:     General: No focal deficit present.  Psychiatric:        Mood and Affect: Mood normal.     Lab Results Lab Results  Component Value Date   WBC 6.9 09/26/2018   HGB 8.7 (L) 09/26/2018   HCT 29.7 (L) 09/26/2018   MCV 99.3 09/26/2018   PLT 362 09/26/2018    Lab Results  Component Value Date   CREATININE 0.67 09/26/2018   BUN 18 09/26/2018   NA 142 09/26/2018   K 3.7 09/26/2018   CL 110 09/26/2018   CO2 22 09/26/2018    Lab Results  Component Value Date   ALT 9 09/12/2018   AST 12 (L) 09/12/2018   ALKPHOS 78 09/12/2018   BILITOT 0.3 09/12/2018     Microbiology: Recent Results (from the past 240 hour(s))  Blood culture (routine x 2)     Status: None (Preliminary result)   Collection Time: 09/26/18  2:07 PM  Result Value Ref Range Status   Specimen Description BLOOD RIGHT HAND  Final   Special Requests   Final    BOTTLES DRAWN AEROBIC AND ANAEROBIC Blood Culture adequate volume   Culture   Final    NO GROWTH < 24 HOURS Performed at [facility], [address]  [place], [place] [address]    Report Status PENDING  Incomplete  Blood culture (routine x 2)     Status: None (Preliminary result)   Collection Time: 09/26/18  2:46 PM  Result Value Ref Range Status   Specimen Description BLOOD RIGHT ANTECUBITAL  Final   Special Requests   Final    BOTTLES DRAWN AEROBIC AND ANAEROBIC Blood Culture results may not be optimal due to an inadequate volume of blood received in culture bottles   Culture   Final    NO GROWTH < 24 HOURS Performed at [facility], [address], [place], [place] [address]    Report Status PENDING  Incomplete    Rexene Alberts, MSN, NP-C [facility] Cell: [phone] Pager: [phone]  09/27/2018 2:57 PM

## 2018-09-27 NOTE — Progress Notes (Signed)
  Echocardiogram 2D Echocardiogram has been performed.  Mackenzie Hughes 09/27/2018, 12:14 PM

## 2018-09-27 NOTE — Progress Notes (Signed)
Pt has ate 4 puddings, drank 4 drinks since 0000. Pt educated on fluid restrictions on this floor. Pt failure to acknowledge education

## 2018-09-27 NOTE — Progress Notes (Signed)
PROGRESS NOTE  Mackenzie Hughes:[id number] DOB: [birth year] DOA: 09/26/2018 PCP: Patient, No Pcp Per   LOS: 1 day    Brief Narrative / Interim history: 38 y.o. 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  -Admitted at [facility] and left [facility] December 10.  -Admitted at [facility] on December 11 and left [facility] December 28.  Her blood culture was still positive as of December 17,019 and she had a septic emboli as well as cerebral abscess and renal and splenic infarctions she was admitted again at [facility] on September 07, 2018 and had UDS positive for amphetamine and opiate at that time.   -Admitted here on 09/12/2018 and left AMA on 09/21/2018.  At that time, the plan was to continue IV antibiotic for 6 weeks.  She was evaluated by [facility] Dr. Laneta Simmers who did not feel patient is a candidate for valve replacement but recommended outpatient follow-up after antibiotic completion. -She was recently admitted 3 days ago at [facility] and left AMA because she did not get IV narcotic.  She was supposed to have stayed until October 25, 2018 for IV antibiotics for her endocarditis but she left AMA.       She returns to the emergency room this afternoon because of her heart rate that is increased as well as feeling hot and cold.  She is complaining of right shoulder pain she denies any injury.  She denies any chest pain or shortness of breath.   Started on IV cefazolin for endocarditis.   Subjective: Complains about right shoulder pain, withdrawal from heroin and Subutex.  Denies chest pain or dyspnea.  Has nausea or vomiting.  She states she would stay this time to complete her treatment course.  Assessment & Plan: Active Problems:   Bacteremia due to methicillin susceptible Staphylococcus aureus (MSSA)   Chronic hepatitis C without hepatic coma (HCC)   Endocarditis of mitral valve   Tobacco user  Opiate dependence (HCC)   IV drug abuse (HCC)   Protein-calorie malnutrition, severe   Noncompliance with medication regimen   Acute and subacute bacterial endocarditis  MSSA bacteremia with mitral valve endocarditis: Recurrent admission with frequent AMA.  CTA chest on admission negative for PE but some concern for RML pneumonia.  However, patient without respiratory symptoms. -Continue Nafcillin-for 6 weeks after negative blood culture -Repeat echocardiogram -Follow blood culture -Discussed patient with Dr. Luciana Axe (infectious disease) who is in agreement with the above. -She may need to complete her treatment course here due to IVDU.  Right shoulder pain: X-ray did not reveal any etiology.  MRI shoulder and CT cervical spine on 1/15 not impressive. -Continue local lidocaine  History of IVDU/drug withdrawal: UDS positive for amphetamine, barbiturates, opiates and cocaine on 09/12/2018 -Started on Suboxone  Protein energy malnutrition -Nutrition consult  Scheduled Meds: . buprenorphine-naloxone  1 tablet Sublingual Daily  . enoxaparin (LOVENOX) injection  40 mg Subcutaneous QHS  . famotidine  20 mg Oral BID  . lidocaine  1 patch Transdermal QHS  . traZODone  50 mg Oral QHS   Continuous Infusions: . sodium chloride 1,000 mL (09/26/18 2221)  . nafcillin IV 2 g (09/27/18 1019)   PRN Meds:.sodium chloride, diphenhydrAMINE, sodium chloride flush  DVT prophylaxis: Subcu Lovenox Code Status: Full code Family Communication: No family member at bedside Disposition Plan: Inpatient for IV antibiotics for bacterial endocarditis and patient with IVDU  Consultants:   Infectious  disease  Procedures:   None  Antimicrobials:  Nafcillin 1/20-->  Objective: Vitals:   09/26/18 1236 09/26/18 2110 09/27/18 0350 09/27/18 1130  BP: 100/72 (!) 132/42 97/80 130/78  Pulse: 61 73 66 70  Resp: 15 20 18 20   Temp: 98.4 F (36.9 C) 98.2 F (36.8 C) 98 F (36.7 C) 98.1 F (36.7 C)    TempSrc: Oral Oral Oral Oral  SpO2: 100% 100% 98% 98%  Weight:   45.4 kg   Height:   5\' 5"  (1.651 m)     Intake/Output Summary (Last 24 hours) at 09/27/2018 1427 Last data filed at 09/27/2018 0830 Gross per 24 hour  Intake 2180 ml  Output -  Net 2180 ml   Filed Weights   09/27/18 0350  Weight: 45.4 kg    Examination:  GENERAL: Appears well. No acute distress.  HEENT: MMM.  Vision and Hearing grossly intact.  NECK: Supple.  No JVD.  LUNGS:  No IWOB. Good air movement. CTAB.  HEART: RRR. 2/6 SEM over apex.  ABD: Bowel sounds present. Soft. Non tender.  MSK/EXT:   no edema bilaterally.  SKIN: no apparent skin lesion.  NEURO: Awake, alert and oriented appropriately.  No gross deficit.    Data Reviewed: I have independently reviewed following labs and imaging studies  CBC: Recent Labs  Lab 09/26/18 1406 09/26/18 1428 09/26/18 2311  WBC 7.8  --  6.9  NEUTROABS 4.8  --   --   HGB 8.5* 10.5* 8.7*  HCT 29.6* 31.0* 29.7*  MCV 99.7  --  99.3  PLT 366  --  362   Basic Metabolic Panel: Recent Labs  Lab 09/26/18 1407 09/26/18 1428 09/26/18 2311  NA 142 142  --   K 3.7 3.7  --   CL 110 110  --   CO2 22  --   --   GLUCOSE 91 88  --   BUN 14 18  --   CREATININE 0.42* 0.30* 0.67  CALCIUM 8.6*  --   --    GFR: Estimated Creatinine Clearance: 70.3 mL/min (by C-G formula based on SCr of 0.67 mg/dL). Liver Function Tests: No results for input(s): AST, ALT, ALKPHOS, BILITOT, PROT, ALBUMIN in the last 168 hours. No results for input(s): LIPASE, AMYLASE in the last 168 hours. No results for input(s): AMMONIA in the last 168 hours. Coagulation Profile: No results for input(s): INR, PROTIME in the last 168 hours. Cardiac Enzymes: No results for input(s): CKTOTAL, CKMB, CKMBINDEX, TROPONINI in the last 168 hours. BNP (last 3 results) No results for input(s): PROBNP in the last 8760 hours. HbA1C: No results for input(s): HGBA1C in the last 72 hours. CBG: No results for  input(s): GLUCAP in the last 168 hours. Lipid Profile: No results for input(s): CHOL, HDL, LDLCALC, TRIG, CHOLHDL, LDLDIRECT in the last 72 hours. Thyroid Function Tests: No results for input(s): TSH, T4TOTAL, FREET4, T3FREE, THYROIDAB in the last 72 hours. Anemia Panel: No results for input(s): VITAMINB12, FOLATE, FERRITIN, TIBC, IRON, RETICCTPCT in the last 72 hours. Urine analysis:    Component Value Date/Time   COLORURINE AMBER (A) 09/12/2018 0344   APPEARANCEUR HAZY (A) 09/12/2018 0344   LABSPEC 1.032 (H) 09/12/2018 0344   PHURINE 5.0 09/12/2018 0344   GLUCOSEU NEGATIVE 09/12/2018 0344   HGBUR NEGATIVE 09/12/2018 0344   BILIRUBINUR SMALL (A) 09/12/2018 0344   KETONESUR NEGATIVE 09/12/2018 0344   PROTEINUR 100 (A) 09/12/2018 0344   NITRITE NEGATIVE 09/12/2018 0344   LEUKOCYTESUR MODERATE (A) 09/12/2018 0344  Sepsis Labs: Invalid input(s): PROCALCITONIN, LACTICIDVEN  Recent Results (from the past 240 hour(s))  Blood culture (routine x 2)     Status: None (Preliminary result)   Collection Time: 09/26/18  2:07 PM  Result Value Ref Range Status   Specimen Description BLOOD RIGHT HAND  Final   Special Requests   Final    BOTTLES DRAWN AEROBIC AND ANAEROBIC Blood Culture adequate volume   Culture   Final    NO GROWTH < 24 HOURS Performed at [facility], [address] [place], [place] [address]    Report Status PENDING  Incomplete  Blood culture (routine x 2)     Status: None (Preliminary result)   Collection Time: 09/26/18  2:46 PM  Result Value Ref Range Status   Specimen Description BLOOD RIGHT ANTECUBITAL  Final   Special Requests   Final    BOTTLES DRAWN AEROBIC AND ANAEROBIC Blood Culture results may not be optimal due to an inadequate volume of blood received in culture bottles   Culture   Final    NO GROWTH < 24 HOURS Performed at [facility], [address] [place], [place] [address]    Report Status PENDING  Incomplete      Radiology  Studies: Ct Angio Chest Pe W And/or Wo Contrast  Result Date: 09/26/2018 CLINICAL DATA:  Chest palpitations and right shoulder pain in an IV drug abuser. History of endocarditis. EXAM: CT ANGIOGRAPHY CHEST WITH CONTRAST TECHNIQUE: Multidetector CT imaging of the chest was performed using the standard protocol during bolus administration of intravenous contrast. Multiplanar CT image reconstructions and MIPs were obtained to evaluate the vascular anatomy. CONTRAST:  75 mL ISOVUE-370 IOPAMIDOL (ISOVUE-370) INJECTION 76% COMPARISON:  CT chest 07/26/2018. Single view of the chest 09/23/2018 and 09/24/2018. FINDINGS: Cardiovascular: No pulmonary embolus is identified. There is cardiomegaly. No pericardial effusion. No aneurysm. No atherosclerotic vascular disease identified. Mediastinum/Nodes: No enlarged mediastinal, hilar, or axillary lymph nodes. Thyroid gland, trachea, and esophagus demonstrate no significant findings. Lungs/Pleura: Very small right pleural effusion is seen. Focal airspace disease is seen in the periphery of the right middle lobe. Peribronchial thickening is noted. Dependent atelectasis is seen. There is some emphysematous disease. Upper Abdomen: Negative. Musculoskeletal: Negative. Review of the MIP images confirms the above findings. IMPRESSION: Negative for pulmonary embolus. Focal airspace disease in the periphery of the right middle lobe has an appearance most worrisome for pneumonia. Peribronchial thickening compatible with bronchitis also noted. Marked cardiomegaly. Emphysema (ICD10-J43.9). Electronically Signed   By: Drusilla Kannerhomas  Dalessio M.D.   On: 09/26/2018 16:21       T. [facility] Pager [phone]  If 7PM-7AM, please contact night-coverage [email or web address] Password [email or web address] 09/27/2018, 2:27 PM

## 2018-09-27 NOTE — Progress Notes (Signed)
OT Cancellation Note  Patient Details Name: Mackenzie Hughes MRN: [id number] DOB: March 04, 1981   Cancelled Treatment:    Reason Eval/Treat Not Completed: Patient at procedure or test/ unavailable. Pt currently having a test performed in room.  Ignacia Palma, OTR/L Acute Rehab Services Pager [phone] Office [phone]     Evette Georges 09/27/2018, 11:31 AM

## 2018-09-27 NOTE — Progress Notes (Signed)
Initial Nutrition Assessment  DOCUMENTATION CODES:   Underweight  INTERVENTION:   -Ensure Enlive po TID, each supplement provides 350 kcal and 20 grams of protein -MVI with minerals  NUTRITION DIAGNOSIS:   Increased nutrient needs related to acute illness as evidenced by estimated needs.  GOAL:   Patient will meet greater than or equal to 90% of their needs  MONITOR:   PO intake, Supplement acceptance, Labs, Weight trends, Skin, I & O's  REASON FOR ASSESSMENT:   Consult Assessment of nutrition requirement/status  ASSESSMENT:   Mackenzie Hughes is a 38 y.o. 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  Her blood culture was still positive as of December 17,019 and she had a septic emboli as well as cerebral abscess and renal and splenic infarctions she was admitted again at [facility] on September 07, 2018 and had UDS positive for amphetamine and opiate at that time.  She was recently admitted 3 days ago at [facility] and left AGAINST MEDICAL ADVICE because she did not get IV narcotic.  She was supposed to have stayed until October 25, 2018 for IV antibiotics for her endocarditis but she left AMA.  She returns to the emergency room this afternoon because of her heart rate that is increased as well as feeling hot and cold.  She is complaining of right shoulder pain she denies any injury.  She denies any chest pain or shortness of breath.    Pt admitted with bacterial endocarditis.   Reviewed I/O's: +2.1 L since admission  Pt lying in bed, very tearful at time of visit. Staff member also at bedside. Unable to obtain further nutrition history at this time or complete nutrition-focused physical exam.   Noted pt fair  appetite. Noted meal completion 50%. Per nursing notes, pt consuming a lot of fluids, including ice cream and pudding (pt is on a regular diet and not on a fluid restriction).   Pt has has been stable over the past 6 months. Suspect poor oral intake PTA due to homelessness issues and polysubstance abuse. Pt with history of malnutrition during last hospitalization earlier this month. Suspect malnutrition is ongoing, however, unable to identify at this time.   Labs reviewed.   Diet Order:   Diet Order            Diet regular Room service appropriate? Yes; Fluid consistency: Thin  Diet effective now              EDUCATION NEEDS:   No education needs have been identified at this time  Skin:  Skin Assessment: Skin Integrity Issues: Skin Integrity Issues:: Stage II Stage II: coccyx  Last BM:  09/26/18  Height:   Ht Readings from Last 1 Encounters:  09/27/18 5\' 5"  (1.651 m)    Weight:   Wt Readings from Last 1 Encounters:  09/27/18 45.4 kg    Ideal Body Weight:  56.8 kg  BMI:  Body mass index is 16.66 kg/m.  Estimated Nutritional Needs:   Kcal:  1600-1800  Protein:  90-105 grams  Fluid:  > 1.6 L    Hartlee Amedee A. Mayford Knife, RD, LDN, CDE Pager: [phone] After hours Pager: [phone]

## 2018-09-27 NOTE — Evaluation (Signed)
Physical Therapy Evaluation Patient Details Name: Mackenzie Hughes MRN: [id number] DOB: 1981/03/18 Today's Date: 09/27/2018   History of Present Illness  38yo female with significant history of IV drug use, ongoing endocarditis with recent admission and multiple AMAs at [facility] and [place] centers. Now presenting to [facility] ED with increased HR, feeling hot/cold, and R shoulder pain. PMH hepatitis C, endocarditis, IV drug abuse, ovarian cancer, wernicke's encephalopathy   Clinical Impression   Patient received sitting in bed, pleasant and willing to work with therapy but with extremely poor safety awareness and very impulsive, Mod(I) with all functional mobility and [id number] but requiring assist for line management to preserve integrity of IV site as she either kept getting tangled in line or picked at IV entry site in skin. Behavior consistent with long term drug use, and patient very impulsive, restless, and antsy during session; poor attention to safety and dynamic obstacles in hallway as well. During gait, patient directly said to PT, "they need to give me suboxone because I'm so weak and my legs collapse without it". She was left sitting at EOB with all needs met this Morning. Feel that patient is at baseline level of function and poor safety awareness/impulsivity is behavioral rather than a factor that PT can address; PT signing off for now, thank you for the referral.     Follow Up Recommendations No PT follow up    Equipment Recommendations  None recommended by PT    Recommendations for Other Services       Precautions / Restrictions Precautions Precautions: Other (comment) Precaution Comments: erratic, antsy behavior; very poor safety awareness  Restrictions Weight Bearing Restrictions: No      Mobility  Bed Mobility Overal bed mobility: Modified Independent                Transfers Overall transfer level: Modified independent Equipment used: None              General transfer comment: required assist for IV line management, no physical assist given   Ambulation/Gait Ambulation/Gait assistance: Modified independent (Device/Increase time) Gait Distance (Feet): 300 Feet Assistive device: None Gait Pattern/deviations: WFL(Within Functional Limits) Gait velocity: WNL    General Gait Details: assist for IV line management, no other physical assist given, poor awareness of obstacles in hallway   Stairs            Wheelchair Mobility    Modified Rankin (Stroke Patients Only)       Balance Overall balance assessment: Independent                                           Pertinent Vitals/Pain Pain Assessment: 0-10 Pain Score: 6  Pain Location: hernia site  Pain Descriptors / Indicators: Aching;Discomfort Pain Intervention(s): Limited activity within patient's tolerance;Monitored during session    Home Living Family/patient expects to be discharged to:: Unsure                 Additional Comments: reports her house is one level with 2 small STE, railings then perseverated on friends apartment and flight of stairs to get to that apartment     Prior Function Level of Independence: Independent               Hand Dominance        Extremity/Trunk Assessment   Upper Extremity Assessment Upper Extremity Assessment: Defer to  OT evaluation    Lower Extremity Assessment Lower Extremity Assessment: Overall WFL for tasks assessed    Cervical / Trunk Assessment Cervical / Trunk Assessment: Normal  Communication   Communication: No difficulties  Cognition Arousal/Alertness: Awake/alert Behavior During Therapy: Anxious;Restless;Impulsive Overall Cognitive Status: Impaired/Different from baseline Area of Impairment: Orientation;Attention;Memory;Following commands;Safety/judgement;Awareness;Problem solving                 Orientation Level: Person;Place;Time;Situation Current Attention  Level: Selective Memory: Decreased short-term memory;Decreased recall of precautions Following Commands: Follows one step commands inconsistently;Follows one step commands with increased time;Follows multi-step commands inconsistently;Follows multi-step commands with increased time Safety/Judgement: Decreased awareness of safety;Decreased awareness of deficits Awareness: Intellectual Problem Solving: Slow processing;Decreased initiation;Difficulty sequencing;Requires tactile cues;Requires verbal cues        General Comments      Exercises     Assessment/Plan    PT Assessment Patent does not need any further PT services  PT Problem List Decreased coordination;Decreased safety awareness       PT Treatment Interventions Gait training    PT Goals (Current goals can be found in the Care Plan section)  Acute Rehab PT Goals Patient Stated Goal: less pain from hernia, "I need suboxone"  PT Goal Formulation: With patient Time For Goal Achievement: 10/11/18 Potential to Achieve Goals: Fair    Frequency Other (Comment)(none- PT signing off )   Barriers to discharge        Co-evaluation               AM-PAC PT "6 Clicks" Mobility  Outcome Measure Help needed turning from your back to your side while in a flat bed without using bedrails?: None Help needed moving from lying on your back to sitting on the side of a flat bed without using bedrails?: None Help needed moving to and from a bed to a chair (including a wheelchair)?: None Help needed standing up from a chair using your arms (e.g., wheelchair or bedside chair)?: None Help needed to walk in hospital room?: None Help needed climbing 3-5 steps with a railing? : None 6 Click Score: 24    End of Session   Activity Tolerance: Patient tolerated treatment well Patient left: in bed;with call bell/phone within reach Nurse Communication: Mobility status;Other (comment)(patient request to walk in hallway ) PT Visit  Diagnosis: Other abnormalities of gait and mobility (R26.89)    Time: 1216-2446 PT Time Calculation (min) (ACUTE ONLY): 17 min   Charges:   PT Evaluation $PT Eval Moderate Complexity: 1 Mod          Deniece Ree PT, DPT, CBIS  Supplemental Physical Therapist [place]    Pager [phone] Acute Rehab Office [phone]

## 2018-09-28 ENCOUNTER — Inpatient Hospital Stay (HOSPITAL_COMMUNITY): Payer: Medicaid Other

## 2018-09-28 DIAGNOSIS — I519 Heart disease, unspecified: Secondary | ICD-10-CM

## 2018-09-28 DIAGNOSIS — Z681 Body mass index (BMI) 19 or less, adult: Secondary | ICD-10-CM

## 2018-09-28 DIAGNOSIS — M25519 Pain in unspecified shoulder: Secondary | ICD-10-CM

## 2018-09-28 DIAGNOSIS — Z792 Long term (current) use of antibiotics: Secondary | ICD-10-CM

## 2018-09-28 DIAGNOSIS — M5489 Other dorsalgia: Secondary | ICD-10-CM

## 2018-09-28 LAB — CBC
HCT: 31.1 % — ABNORMAL LOW (ref 36.0–46.0)
Hemoglobin: 9.2 g/dL — ABNORMAL LOW (ref 12.0–15.0)
MCH: 30.1 pg (ref 26.0–34.0)
MCHC: 29.6 g/dL — ABNORMAL LOW (ref 30.0–36.0)
MCV: 101.6 fL — ABNORMAL HIGH (ref 80.0–100.0)
Platelets: 354 10*3/uL (ref 150–400)
RBC: 3.06 MIL/uL — AB (ref 3.87–5.11)
RDW: 26.1 % — ABNORMAL HIGH (ref 11.5–15.5)
WBC: 9.7 10*3/uL (ref 4.0–10.5)
nRBC: 0 % (ref 0.0–0.2)

## 2018-09-28 LAB — BASIC METABOLIC PANEL
Anion gap: 9 (ref 5–15)
BUN: 17 mg/dL (ref 6–20)
CHLORIDE: 106 mmol/L (ref 98–111)
CO2: 25 mmol/L (ref 22–32)
Calcium: 8.3 mg/dL — ABNORMAL LOW (ref 8.9–10.3)
Creatinine, Ser: 0.63 mg/dL (ref 0.44–1.00)
GFR calc Af Amer: >60 mL/min (ref >60)
GFR calc non Af Amer: >60 mL/min (ref >60)
Glucose, Bld: 103 mg/dL — ABNORMAL HIGH (ref 70–99)
POTASSIUM: 3.8 mmol/L (ref 3.5–5.1)
Sodium: 140 mmol/L (ref 135–145)

## 2018-09-28 MED ORDER — KETOROLAC TROMETHAMINE 15 MG/ML IJ SOLN
15.0000 mg | Freq: Four times a day (QID) | INTRAMUSCULAR | Status: DC | PRN
  Administered 2018-09-28 – 2018-10-01 (×10): 15 mg via INTRAVENOUS
  Filled 2018-09-28 (×11): qty 1

## 2018-09-28 MED ORDER — LORAZEPAM 2 MG/ML IJ SOLN
1.0000 mg | Freq: Once | INTRAMUSCULAR | Status: AC
  Administered 2018-09-28: 1 mg via INTRAVENOUS
  Filled 2018-09-28: qty 1

## 2018-09-28 NOTE — Progress Notes (Signed)
PROGRESS NOTE    Mackenzie Hughes  JHE:[id number] DOB: 12-29-1980 DOA: 09/26/2018 PCP: Patient, No Pcp Per   Brief Narrative: Mackenzie Hughes is a 38 y.o. 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 She presented secondary to tachycardia and chills. Patient has been in/out of hospitals secondary to MSSA endocarditis/bacteremia and incomplete treatment secondary to frequent AMA discharges. She has been evaluated by cardiothoracic surgery for a heart valve replacement, for which she is not a candidate.   Assessment & Plan:   Active Problems:   Bacteremia due to methicillin susceptible Staphylococcus aureus (MSSA)   Chronic hepatitis C without hepatic coma (HCC)   Endocarditis of mitral valve   Tobacco user   Opiate dependence (HCC)   IV drug abuse (HCC)   Protein-calorie malnutrition, severe   Noncompliance with medication regimen   Acute and subacute bacterial endocarditis   MSSA Bacteremia Mitral valve endocarditis Course complicated by frequent AMA discharges. Blood cultures from 1/6 no growth (final) and recent blood cultures from 1/20 currently no growth to date. -ID recommendations: Nafcillin IV  Headache In setting of persistent bacteremia/endocarditis and inadequate treatment.  -MRI brain  Back pain In setting of persistent bacteremia. Does not appear to have radiculopathy -MRI thoracic/lumbar spine -Toradol IV for now  History of IV drug abuse Patient states she does not plan to use again. Started on Suboxone inpatient but does not have a means to continue on discharge. -Continue Suboxone for now; discussed weaning prior to discharge vs finding an outpatient prescriber  Right shoulder pain X-ray negative for fracture  Severe malnutrition -Ensure  Pressure injury Mid coccyx, POA   DVT prophylaxis: Lovenox Code Status:   Code Status: Full Code Family  Communication: None at bedside Disposition Plan: Discharge pending ID recommendations for abx duration   Consultants:   Infectious disease  Procedures:   None  Antimicrobials:  Nafcillin    Subjective: Headache that started weeks ago and has remained unchanged, located bi-termporal and described as throbbing with some episodes of right sided sharp pain. No other associated symptoms. She also reports worsening achy back pain that started a few weeks ago which has not improved with Suboxone  Objective: Vitals:   09/27/18 1651 09/27/18 1932 09/28/18 0552 09/28/18 0929  BP: 100/69 108/84 105/86 (!) 119/95  Pulse: 74 71 70 64  Resp: 20 18 18    Temp:  98.3 F (36.8 C) 97.8 F (36.6 C)   TempSrc:  Oral Oral   SpO2: 95% 100% 94% 100%  Weight:      Height:        Intake/Output Summary (Last 24 hours) at 09/28/2018 1037 Last data filed at 09/28/2018 0648 Gross per 24 hour  Intake 1294.42 ml  Output 100 ml  Net 1194.42 ml   Filed Weights   09/27/18 0350  Weight: 45.4 kg    Examination:  General exam: Appears calm and comfortable Respiratory system: Clear to auscultation. Respiratory effort normal. Cardiovascular system: S1 & S2 heard, RRR. 2/6 systolic murmur Gastrointestinal system: Abdomen is nondistended, soft and nontender. No organomegaly or masses felt. Normal bowel sounds heard. Central nervous system: Alert and oriented. No focal neurological deficits. Musculoskeletal: No edema. No calf tenderness. Point tenderness over thoracic back with no overlying erythema Skin: No cyanosis. No rashes Psychiatry: Judgement and insight appear normal. Mood & affect appropriate.     Data Reviewed: I have personally reviewed following labs and imaging studies  CBC: Recent  Labs  Lab 09/26/18 1406 09/26/18 1428 09/26/18 2311 09/28/18 0512  WBC 7.8  --  6.9 9.7  NEUTROABS 4.8  --   --   --   HGB 8.5* 10.5* 8.7* 9.2*  HCT 29.6* 31.0* 29.7* 31.1*  MCV 99.7  --  99.3  101.6*  PLT 366  --  362 354   Basic Metabolic Panel: Recent Labs  Lab 09/26/18 1407 09/26/18 1428 09/26/18 2311 09/28/18 0512  NA 142 142  --  140  K 3.7 3.7  --  3.8  CL 110 110  --  106  CO2 22  --   --  25  GLUCOSE 91 88  --  103*  BUN 14 18  --  17  CREATININE 0.42* 0.30* 0.67 0.63  CALCIUM 8.6*  --   --  8.3*   GFR: Estimated Creatinine Clearance: 70.3 mL/min (by C-G formula based on SCr of 0.63 mg/dL). Liver Function Tests: No results for input(s): AST, ALT, ALKPHOS, BILITOT, PROT, ALBUMIN in the last 168 hours. No results for input(s): LIPASE, AMYLASE in the last 168 hours. No results for input(s): AMMONIA in the last 168 hours. Coagulation Profile: No results for input(s): INR, PROTIME in the last 168 hours. Cardiac Enzymes: No results for input(s): CKTOTAL, CKMB, CKMBINDEX, TROPONINI in the last 168 hours. BNP (last 3 results) No results for input(s): PROBNP in the last 8760 hours. HbA1C: No results for input(s): HGBA1C in the last 72 hours. CBG: No results for input(s): GLUCAP in the last 168 hours. Lipid Profile: No results for input(s): CHOL, HDL, LDLCALC, TRIG, CHOLHDL, LDLDIRECT in the last 72 hours. Thyroid Function Tests: No results for input(s): TSH, T4TOTAL, FREET4, T3FREE, THYROIDAB in the last 72 hours. Anemia Panel: No results for input(s): VITAMINB12, FOLATE, FERRITIN, TIBC, IRON, RETICCTPCT in the last 72 hours. Sepsis Labs: No results for input(s): PROCALCITON, LATICACIDVEN in the last 168 hours.  Recent Results (from the past 240 hour(s))  Blood culture (routine x 2)     Status: None (Preliminary result)   Collection Time: 09/26/18  2:07 PM  Result Value Ref Range Status   Specimen Description BLOOD RIGHT HAND  Final   Special Requests   Final    BOTTLES DRAWN AEROBIC AND ANAEROBIC Blood Culture adequate volume   Culture   Final    NO GROWTH 2 DAYS Performed at [facility], [address] [place], [place] [address]    Report  Status PENDING  Incomplete  Blood culture (routine x 2)     Status: None (Preliminary result)   Collection Time: 09/26/18  2:46 PM  Result Value Ref Range Status   Specimen Description BLOOD RIGHT ANTECUBITAL  Final   Special Requests   Final    BOTTLES DRAWN AEROBIC AND ANAEROBIC Blood Culture results may not be optimal due to an inadequate volume of blood received in culture bottles   Culture   Final    NO GROWTH 2 DAYS Performed at [facility], [address] [place], [place] [address]    Report Status PENDING  Incomplete         Radiology Studies: Ct Angio Chest Pe W And/or Wo Contrast  Result Date: 09/26/2018 CLINICAL DATA:  Chest palpitations and right shoulder pain in an IV drug abuser. History of endocarditis. EXAM: CT ANGIOGRAPHY CHEST WITH CONTRAST TECHNIQUE: Multidetector CT imaging of the chest was performed using the standard protocol during bolus administration of intravenous contrast. Multiplanar CT image reconstructions and MIPs  were obtained to evaluate the vascular anatomy. CONTRAST:  75 mL ISOVUE-370 IOPAMIDOL (ISOVUE-370) INJECTION 76% COMPARISON:  CT chest 07/26/2018. Single view of the chest 09/23/2018 and 09/24/2018. FINDINGS: Cardiovascular: No pulmonary embolus is identified. There is cardiomegaly. No pericardial effusion. No aneurysm. No atherosclerotic vascular disease identified. Mediastinum/Nodes: No enlarged mediastinal, hilar, or axillary lymph nodes. Thyroid gland, trachea, and esophagus demonstrate no significant findings. Lungs/Pleura: Very small right pleural effusion is seen. Focal airspace disease is seen in the periphery of the right middle lobe. Peribronchial thickening is noted. Dependent atelectasis is seen. There is some emphysematous disease. Upper Abdomen: Negative. Musculoskeletal: Negative. Review of the MIP images confirms the above findings. IMPRESSION: Negative for pulmonary embolus. Focal airspace disease in the periphery of the right  middle lobe has an appearance most worrisome for pneumonia. Peribronchial thickening compatible with bronchitis also noted. Marked cardiomegaly. Emphysema (ICD10-J43.9). Electronically Signed   By: Drusilla Kanner M.D.   On: 09/26/2018 16:21        Scheduled Meds: . buprenorphine-naloxone  1 tablet Sublingual Daily  . enoxaparin (LOVENOX) injection  40 mg Subcutaneous QHS  . famotidine  20 mg Oral BID  . feeding supplement (ENSURE ENLIVE)  237 mL Oral TID BM  . lidocaine  1 patch Transdermal QHS  . multivitamin with minerals  1 tablet Oral Daily  . traZODone  50 mg Oral QHS   Continuous Infusions: . sodium chloride 1,000 mL (09/26/18 2221)  . nafcillin IV 2 g (09/28/18 0647)     LOS: 2 days     Jacquelin Hawking, MD [facility] 09/28/2018, 10:37 AM  If 7PM-7AM, please contact night-coverage [email or web address]

## 2018-09-28 NOTE — Progress Notes (Signed)
Patient continues to complain of back pain. On call MD notified. Patient was seen asleep between complaints of pain. Patient refused vital signs.

## 2018-09-28 NOTE — Evaluation (Signed)
Occupational Therapy Evaluation Patient Details Name: Mackenzie Hughes MRN: [id number] DOB: Nov 19, 1980 Today's Date: 09/28/2018    History of Present Illness 38yo female with significant history of IV drug use, ongoing endocarditis with recent admission and multiple AMAs at [facility] and [facility]. Now presenting to [facility] ED with increased HR, feeling hot/cold, and R shoulder pain. PMH hepatitis C, endocarditis, IV drug abuse, ovarian cancer, wernicke's encephalopathy    Clinical Impression   Pt is functioning independently. No OT needs.    Follow Up Recommendations  No OT follow up    Equipment Recommendations  None recommended by OT    Recommendations for Other Services       Precautions / Restrictions Precautions Precautions: None Restrictions Weight Bearing Restrictions: No      Mobility Bed Mobility Overal bed mobility: Modified Independent             General bed mobility comments: HOB up  Transfers Overall transfer level: Independent Equipment used: None             General transfer comment: pt managing IV line independently    Balance Overall balance assessment: Independent                                         ADL either performed or assessed with clinical judgement   ADL Overall ADL's : Independent                                             Vision Baseline Vision/History: Wears glasses Patient Visual Report: No change from baseline       Perception     Praxis      Pertinent Vitals/Pain Pain Assessment: Faces Faces Pain Scale: No hurt     Hand Dominance Right   Extremity/Trunk Assessment Upper Extremity Assessment Upper Extremity Assessment: Overall WFL for tasks assessed   Lower Extremity Assessment Lower Extremity Assessment: Overall WFL for tasks assessed   Cervical / Trunk Assessment Cervical / Trunk Assessment: Normal   Communication Communication Communication: No  difficulties   Cognition Arousal/Alertness: Awake/alert Behavior During Therapy: WFL for tasks assessed/performed Overall Cognitive Status: Within Functional Limits for tasks assessed                                     General Comments       Exercises     Shoulder Instructions      Home Living Family/patient expects to be discharged to:: Unsure                                        Prior Functioning/Environment Level of Independence: Independent                 OT Problem List:        OT Treatment/Interventions:      OT Goals(Current goals can be found in the care plan section) Acute Rehab OT Goals Patient Stated Goal: to go home  OT Frequency:     Barriers to D/C:            Co-evaluation  AM-PAC OT "6 Clicks" Daily Activity     Outcome Measure Help from another person eating meals?: None Help from another person taking care of personal grooming?: None Help from another person toileting, which includes using toliet, bedpan, or urinal?: None Help from another person bathing (including washing, rinsing, drying)?: None Help from another person to put on and taking off regular upper body clothing?: None Help from another person to put on and taking off regular lower body clothing?: None 6 Click Score: 24   End of Session    Activity Tolerance: Patient tolerated treatment well Patient left: in chair;with call bell/phone within reach  OT Visit Diagnosis: Muscle weakness (generalized) (M62.81)                Time: 1030-1047 OT Time Calculation (min): 17 min Charges:  OT General Charges $OT Visit: 1 Visit OT Evaluation $OT Eval Low Complexity: 1 Low  Martie Round, OTR/L Acute Rehabilitation Services Pager: [phone] Office: [phone]  Evern Bio 09/28/2018, 10:51 AM

## 2018-09-28 NOTE — Progress Notes (Signed)
Nutrition Follow-up  DOCUMENTATION CODES:   Underweight, Severe malnutrition in context of chronic illness  INTERVENTION:   -Continue Ensure Enlive po TID, each supplement provides 350 kcal and 20 grams of protein -Continue MVI with minerals daily  NUTRITION DIAGNOSIS:   Severe Malnutrition related to chronic illness(IVDU) as evidenced by energy intake < or equal to 75% for > or equal to 1 month, severe fat depletion, moderate fat depletion, moderate muscle depletion, severe muscle depletion.  Ongoing  GOAL:   Patient will meet greater than or equal to 90% of their needs  Progressing  MONITOR:   PO intake, Supplement acceptance, Weight trends, Skin, I & O's  REASON FOR ASSESSMENT:   Consult Assessment of nutrition requirement/status  ASSESSMENT:   Mackenzie Hughes is a 38 y.o. female with medical history significant for intravenous drug use, hepatitis C, remote ovarian cancer, WERNICKE'S encephalopathy  on July 2019, MRSA of mitral valve endocarditis, [facility]  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  She was recently admitted 3 days ago at [facility] and left AGAINST MEDICAL ADVICE because she did not get IV narcotic.  She was supposed to have stayed until October 25, 2018 for IV antibiotics for her endocarditis but she left AMA.  She returns to the emergency room this afternoon because of her heart rate that is increased as well as feeling hot and cold.  She is complaining of right shoulder pain she denies any injury.  She denies any chest pain or shortness of breath.    Reviewed I/O's: +1.3 L x 24 hours and +3.4 L since  admission  Spoke with pt, who had flat affect. She reports good appetite currently. Observed meal tray from breakfast- consumed about 75% of meal. Pt shares that she was hospitalized a few weeks ago; when she is in the hospital, she has a great appetite and consumes almost all of the food that is offered to her. However, intake is very poor when out of the hospital. Pt shares she never feels like eating and may consumes 1 meal and a snack per day at best (breakfast of a bowl of cereal and snack of a Little Debbie cake).   Pt reports ongoing weight loss. She shares that she has always been small framed and UBW is around 120#. Pt shares that she was up to 150# when she got out of prison approximately 1.5 years ago. No recent wt changes available to assess.   Discussed with pt importance of good meal and supplement intake to promote healing. Pt enjoys Ensure supplements and is willing to continue taking them.   Labs reviewed.   NUTRITION - FOCUSED PHYSICAL EXAM:    Most Recent Value  Orbital Region  Moderate depletion  Upper Arm Region  Severe depletion  Thoracic and Lumbar Region  Severe depletion  Buccal Region  Moderate depletion  Temple Region  Moderate depletion  Clavicle Bone Region  Severe depletion  Clavicle and Acromion Bone Region  Severe depletion  Scapular Bone Region  Severe depletion  Dorsal Hand  Moderate depletion  Patellar Region  Severe depletion  Anterior Thigh Region  Severe depletion  Posterior Calf Region  Severe depletion  Edema (RD Assessment)  None  Hair  Reviewed  Eyes  Reviewed  Mouth  Reviewed  Skin  Reviewed  Nails  Reviewed       Diet Order:   Diet Order            Diet regular Room service appropriate? Yes; Fluid consistency: Thin  Diet effective now              EDUCATION NEEDS:   Education needs have been addressed  Skin:  Skin Assessment: Skin Integrity Issues: Skin Integrity Issues:: Stage II Stage II: coccyx  Last BM:   09/26/18  Height:   Ht Readings from Last 1 Encounters:  09/27/18 5\' 5"  (1.651 m)    Weight:   Wt Readings from Last 1 Encounters:  09/27/18 45.4 kg    Ideal Body Weight:  56.8 kg  BMI:  Body mass index is 16.66 kg/m.  Estimated Nutritional Needs:   Kcal:  1600-1800  Protein:  90-105 grams  Fluid:  > 1.6 L    Katniss Weedman A. Mayford KnifeWilliams, RD, LDN, CDE Pager: [phone] After hours Pager: [phone]

## 2018-09-28 NOTE — Progress Notes (Signed)
[facility]  Date of Admission:  09/26/2018     Total days of antibiotics 23 (approx and interrupted)  Oritavancin 09/27/2018   Nafcillin ~ 23 days with some interruptions d/t AMA   Patient ID: Mackenzie Hughes is a 38 y.o. female with  Active Problems:   Bacteremia due to methicillin susceptible Staphylococcus aureus (MSSA)   Chronic hepatitis C without hepatic coma (HCC)   Endocarditis of mitral valve   Tobacco user   Opiate dependence (HCC)   IV drug abuse (HCC)   Protein-calorie malnutrition, severe   Noncompliance with medication regimen   Acute and subacute bacterial endocarditis   . buprenorphine-naloxone  1 tablet Sublingual Daily  . enoxaparin (LOVENOX) injection  40 mg Subcutaneous QHS  . famotidine  20 mg Oral BID  . feeding supplement (ENSURE ENLIVE)  237 mL Oral TID BM  . lidocaine  1 patch Transdermal QHS  . multivitamin with minerals  1 tablet Oral Daily  . traZODone  50 mg Oral QHS    SUBJECTIVE: Still in a large amount of pain with regards to upper back/shoulder. Denies any fevers, chills. Hates her IV pole and would like to not be hooked up all the time.   No Known Allergies  OBJECTIVE: Vitals:   09/27/18 1651 09/27/18 1932 09/28/18 0552 09/28/18 0929  BP: 100/69 108/84 105/86 (!) 119/95  Pulse: 74 71 70 64  Resp: 20 18 18    Temp:  98.3 F (36.8 C) 97.8 F (36.6 C)   TempSrc:  Oral Oral   SpO2: 95% 100% 94% 100%  Weight:      Height:       Body mass index is 16.66 kg/m.  Physical Exam Vitals signs and nursing note reviewed.  Constitutional:      Appearance: She is well-developed. She is ill-appearing.     Comments: Thin, malnourished. Sitting in bed.   HENT:     Mouth/Throat:     Mouth: No oral lesions.     Dentition: Normal dentition. No dental abscesses.  Cardiovascular:     Rate and Rhythm: Normal rate and regular rhythm.     Heart sounds: Normal heart sounds.  Pulmonary:     Effort:  Pulmonary effort is normal.     Breath sounds: Normal breath sounds.  Abdominal:     General: There is no distension.     Palpations: Abdomen is soft.     Tenderness: There is no abdominal tenderness.  Musculoskeletal:        General: No swelling.  Lymphadenopathy:     Cervical: No cervical adenopathy.  Skin:    General: Skin is warm and dry.     Capillary Refill: Capillary refill takes less than 2 seconds.     Coloration: Skin is not jaundiced.     Findings: No rash.  Neurological:     Mental Status: She is alert and oriented to person, place, and time.  Psychiatric:        Judgment: Judgment normal.    Lab Results Lab Results  Component Value Date   WBC 9.7 09/28/2018   HGB 9.2 (L) 09/28/2018   HCT 31.1 (L) 09/28/2018   MCV 101.6 (H) 09/28/2018   PLT 354 09/28/2018    Lab Results  Component Value Date   CREATININE 0.63 09/28/2018   BUN 17 09/28/2018   NA 140 09/28/2018   K 3.8 09/28/2018   CL 106 09/28/2018   CO2 25  09/28/2018    Lab Results  Component Value Date   ALT 9 09/12/2018   AST 12 (L) 09/12/2018   ALKPHOS 78 09/12/2018   BILITOT 0.3 09/12/2018     Microbiology: Recent Results (from the past 240 hour(s))  Blood culture (routine x 2)     Status: None (Preliminary result)   Collection Time: 09/26/18  2:07 PM  Result Value Ref Range Status   Specimen Description BLOOD RIGHT HAND  Final   Special Requests   Final    BOTTLES DRAWN AEROBIC AND ANAEROBIC Blood Culture adequate volume   Culture   Final    NO GROWTH 2 DAYS Performed at [facility], [address] [place], [place] [address]    Report Status PENDING  Incomplete  Blood culture (routine x 2)     Status: None (Preliminary result)   Collection Time: 09/26/18  2:46 PM  Result Value Ref Range Status   Specimen Description BLOOD RIGHT ANTECUBITAL  Final   Special Requests   Final    BOTTLES DRAWN AEROBIC AND ANAEROBIC Blood Culture results may not be optimal due to an inadequate  volume of blood received in culture bottles   Culture   Final    NO GROWTH 2 DAYS Performed at [facility], [address] [place], [place] [address]    Report Status PENDING  Incomplete    ASSESSMENT: Afebrile and hemodynamically stable. No signs of severe valve dysfunction on exam - echo noted with severe MR and mobile lobulated mass on posterior MV leaflet. She was seen by [facility] on 1/07 [facility]([place]); no indication for urgent surgery and given her overall malnutrition and deconditioned state and not eligible for elective replacement. She has no signs of worsening HF symptoms and stable echo - no need to re-eval at present.   In December 15 @ [facility] facility head MRI revealed multiple infratentorial and supratentorial lesions with ring enhancement ?scattered abscesses vs septic thromboemboli. These findings were also to a greater extent present in July 2019 - would re-image her brain in February to follow for resolution or earlier if she develops headaches or CNS changes.   She has had about 23 days of interrupted antibiotic therapy with nafcillin/oxacillin @ [facility] and now here given oritavancin. Blood cultures remain negative. Would continue therapy as long as she is agreeable to stay through 2/18.   She has an appointment in ID for follow up already made March 3rd with Dr. Orvan Falconerampbell   PLAN: 1. Continue Nafcillin  2. Follow for clinical changes  3. Follow micro data to ensure no bacteremia   Rexene AlbertsStephanie Christalyn Goertz, MSN, NP-C [facility] Cell: [phone] Pager: [phone]  09/28/2018  10:05 AM

## 2018-09-29 DIAGNOSIS — F419 Anxiety disorder, unspecified: Secondary | ICD-10-CM

## 2018-09-29 DIAGNOSIS — J90 Pleural effusion, not elsewhere classified: Secondary | ICD-10-CM

## 2018-09-29 MED ORDER — LORAZEPAM 0.5 MG PO TABS
0.5000 mg | ORAL_TABLET | Freq: Once | ORAL | Status: AC
  Administered 2018-09-29: 0.5 mg via ORAL
  Filled 2018-09-29: qty 1

## 2018-09-29 MED ORDER — HYDROXYZINE HCL 25 MG PO TABS
50.0000 mg | ORAL_TABLET | Freq: Four times a day (QID) | ORAL | Status: DC | PRN
  Administered 2018-09-29 – 2018-09-30 (×3): 50 mg via ORAL
  Filled 2018-09-29 (×3): qty 2

## 2018-09-29 MED ORDER — METHOCARBAMOL 500 MG PO TABS
750.0000 mg | ORAL_TABLET | Freq: Three times a day (TID) | ORAL | Status: DC | PRN
  Administered 2018-09-29 – 2018-09-30 (×2): 750 mg via ORAL
  Filled 2018-09-29 (×3): qty 2

## 2018-09-29 MED ORDER — CLONIDINE HCL 0.1 MG PO TABS
0.1000 mg | ORAL_TABLET | Freq: Two times a day (BID) | ORAL | Status: DC
  Administered 2018-09-29 – 2018-09-30 (×4): 0.1 mg via ORAL
  Filled 2018-09-29 (×4): qty 1

## 2018-09-29 MED ORDER — HYDROXYZINE HCL 25 MG PO TABS: 25.0000 mg | ORAL_TABLET | Freq: Four times a day (QID) | ORAL | Status: DC | PRN

## 2018-09-29 NOTE — Plan of Care (Signed)

## 2018-09-29 NOTE — Progress Notes (Signed)
RN rounded on pt. Pt is asleep. 

## 2018-09-29 NOTE — Progress Notes (Signed)
Pt states shes getting very anxious. Paged provider. Awaiting orders

## 2018-09-29 NOTE — Progress Notes (Signed)
[facility]  Date of Admission:  09/26/2018     Total days of antibiotics 24 (approx and interrupted)  Oritavancin 09/27/2018   Nafcillin ~ 23 days with some interruptions d/t AMA   Patient ID: Mackenzie Hughes is a 38 y.o. female with  Active Problems:   Bacteremia due to methicillin susceptible Staphylococcus aureus (MSSA)   Chronic hepatitis C without hepatic coma (HCC)   Endocarditis of mitral valve   Tobacco user   Opiate dependence (HCC)   IV drug abuse (HCC)   Protein-calorie malnutrition, severe   Noncompliance with medication regimen   Acute and subacute bacterial endocarditis   . buprenorphine-naloxone  1 tablet Sublingual Daily  . enoxaparin (LOVENOX) injection  40 mg Subcutaneous QHS  . famotidine  20 mg Oral BID  . feeding supplement (ENSURE ENLIVE)  237 mL Oral TID BM  . lidocaine  1 patch Transdermal QHS  . multivitamin with minerals  1 tablet Oral Daily  . traZODone  50 mg Oral QHS    SUBJECTIVE: Sleeping soundly in bed.   No Known Allergies  OBJECTIVE: Vitals:   09/28/18 1209 09/28/18 2016 09/29/18 0608 09/29/18 0814  BP: 105/84 109/81 119/86 98/83  Pulse: 61 77 68 77  Resp: 16 18 18 16   Temp: 98.9 F (37.2 C) 98.6 F (37 C) 98.5 F (36.9 C) 98.2 F (36.8 C)  TempSrc: Oral Oral Oral Oral  SpO2: 98% 98% 100% 97%  Weight:   45.9 kg   Height:       Body mass index is 16.82 kg/m.  Lab Results Lab Results  Component Value Date   WBC 9.7 09/28/2018   HGB 9.2 (L) 09/28/2018   HCT 31.1 (L) 09/28/2018   MCV 101.6 (H) 09/28/2018   PLT 354 09/28/2018    Lab Results  Component Value Date   CREATININE 0.63 09/28/2018   BUN 17 09/28/2018   NA 140 09/28/2018   K 3.8 09/28/2018   CL 106 09/28/2018   CO2 25 09/28/2018    Lab Results  Component Value Date   ALT 9 09/12/2018   AST 12 (L) 09/12/2018   ALKPHOS 78 09/12/2018   BILITOT 0.3 09/12/2018     Microbiology: Recent Results (from the past 240  hour(s))  Blood culture (routine x 2)     Status: None (Preliminary result)   Collection Time: 09/26/18  2:07 PM  Result Value Ref Range Status   Specimen Description BLOOD RIGHT HAND  Final   Special Requests   Final    BOTTLES DRAWN AEROBIC AND ANAEROBIC Blood Culture adequate volume   Culture   Final    NO GROWTH 2 DAYS Performed at [facility], [address], [place], [place] [address]    Report Status PENDING  Incomplete  Blood culture (routine x 2)     Status: None (Preliminary result)   Collection Time: 09/26/18  2:46 PM  Result Value Ref Range Status   Specimen Description BLOOD RIGHT ANTECUBITAL  Final   Special Requests   Final    BOTTLES DRAWN AEROBIC AND ANAEROBIC Blood Culture results may not be optimal due to an inadequate volume of blood received in culture bottles   Culture   Final    NO GROWTH 2 DAYS Performed at [facility], [address], [place], [place] [address]    Report Status PENDING  Incomplete    ASSESSMENT: Mackenzie Hughes was sleeping soundly and  I did not wake her today.  Chart reviewed - continues to be afebrile and hemodynamically stable.   Repeat MRI yesterday noted w/o mention of abscess formation on non-contrast study but microischemic changes. MRI of spine w/o signs of osteomyelitis or discitis (both non-contrast studies).   CT of chest 1/20 reveals very tiny pleural effusion on the R with focal airspace disease in the RML with peribronchial thickening worrisome for PNA. She has no increased oxygen requirements at this time and no findings - would continue nafcillin and observe.   Blood cultures remain negative. Would continue therapy as long as she is agreeable to stay through 2/18.   Having some anxiety. Will ask Chaplain team [facility] while inpatient receiving treatment. We talked about this earlier in the week and she was open to it.   Would also ask dental surgery to evaluate for extractions while she is  here as per previous recommendations from Dr. Laneta SimmersBartle and Dr. Orvan Falconerampbell in preparation for possible elective valve replacement after she completes treatment for endocarditis.   She has an appointment in ID for follow up already made March 3rd with Dr. Orvan Falconerampbell   PLAN: 1. Continue Nafcillin  2. Follow for clinical changes  3. Follow micro data 4. Dental evaluation with potential need for valve replacement  5. Chaplain referral for support   Rexene AlbertsStephanie Dixon, MSN, NP-C [facility] Cell: [phone] Pager: [phone]  09/29/2018  9:42 AM

## 2018-09-29 NOTE — Progress Notes (Signed)
Went to see patient who didn't seem to be feeling well at time.  I will not clear the consult as the patient was not interested but may have a need when she is feeling better. Patient was not interested in prayer.  I sense she may be more receptive a later time. Phebe CollaDonna S Tinna Kolker, Chaplain   09/29/18 1500  Clinical Encounter Type  Visited With Patient  Visit Type Initial  Referral From Patient  Consult/Referral To Chaplain  Spiritual Encounters  Spiritual Needs Other (Comment) (patient did not want prayer. maybe come another time.)

## 2018-09-29 NOTE — Progress Notes (Signed)
PROGRESS NOTE    Mackenzie Hughes  ZOX:[id number] DOB: 08-19-1981 DOA: 09/26/2018 PCP: Patient, No Pcp Per   Brief Narrative: Mackenzie Hughes is a 38 y.o. 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 She presented secondary to tachycardia and chills. Patient has been in/out of hospitals secondary to MSSA endocarditis/bacteremia and incomplete treatment secondary to frequent AMA discharges. She has been evaluated by cardiothoracic surgery for a heart valve replacement, for which she is not a candidate.   Assessment & Plan:   Active Problems:   Bacteremia due to methicillin susceptible Staphylococcus aureus (MSSA)   Chronic hepatitis C without hepatic coma (HCC)   Endocarditis of mitral valve   Tobacco user   Opiate dependence (HCC)   IV drug abuse (HCC)   Protein-calorie malnutrition, severe   Noncompliance with medication regimen   Acute and subacute bacterial endocarditis   MSSA Bacteremia Mitral valve endocarditis Course complicated by frequent AMA discharges. Blood cultures from 1/6 no growth (final) and recent blood cultures from 1/20 currently no growth to date. -ID recommendations: Nafcillin IV, dental consult  Headache In setting of persistent bacteremia/endocarditis and inadequate treatment. MRI negative for abscess  Back pain In setting of persistent bacteremia. Does not appear to have radiculopathy. MRI thoracic/lumbar spine not significant for abscess or discitis.  -Toradol IV prn -Robaxin  History of IV drug abuse Patient states she does not plan to use again. Started on Suboxone inpatient but does not have a means to continue on discharge. -Continue Suboxone for now; discussed weaning prior to discharge vs finding an outpatient prescriber -Clonidine and Atarax to help with withdrawal symptoms -COWS  Right shoulder pain X-ray negative for fracture  Severe  malnutrition -Ensure  Pressure injury Mid coccyx, POA   DVT prophylaxis: Lovenox Code Status:   Code Status: Full Code Family Communication: None at bedside Disposition Plan: Discharge pending ID recommendations for abx duration   Consultants:   Infectious disease  Procedures:   None  Antimicrobials:  Nafcillin    Subjective: States she is very anxious and has irritability. Wants her Suboxone to be increased.  Objective: Vitals:   09/28/18 2016 09/29/18 0608 09/29/18 0814 09/29/18 1131  BP: 109/81 119/86 98/83 (!) 118/93  Pulse: 77 68 77 71  Resp: 18 18 16 16   Temp: 98.6 F (37 C) 98.5 F (36.9 C) 98.2 F (36.8 C) 98.2 F (36.8 C)  TempSrc: Oral Oral Oral Oral  SpO2: 98% 100% 97% 99%  Weight:  45.9 kg    Height:        Intake/Output Summary (Last 24 hours) at 09/29/2018 1240 Last data filed at 09/29/2018 0610 Gross per 24 hour  Intake 954.75 ml  Output 100 ml  Net 854.75 ml   Filed Weights   09/27/18 0350 09/29/18 0608  Weight: 45.4 kg 45.9 kg    Examination:  General exam: Appears calm and comfortable Respiratory system: Clear to auscultation. Respiratory effort normal. Cardiovascular system: S1 & S2 heard, RRR. 2/6 systolic murmur Gastrointestinal system: Abdomen is nondistended, soft and nontender. No organomegaly or masses felt. Normal bowel sounds heard. Central nervous system: Alert and oriented. No focal neurological deficits. Extremities: No edema. No calf tenderness Skin: No cyanosis. No rashes Psychiatry: Judgement and insight appear normal. Mood & affect appropriate.     Data Reviewed: I have personally reviewed following labs and imaging studies  CBC: Recent Labs  Lab 09/26/18 1406 09/26/18 1428 09/26/18 2311 09/28/18 [id number]  WBC 7.8  --  6.9 9.7  NEUTROABS 4.8  --   --   --   HGB 8.5* 10.5* 8.7* 9.2*  HCT 29.6* 31.0* 29.7* 31.1*  MCV 99.7  --  99.3 101.6*  PLT 366  --  362 354   Basic Metabolic Panel: Recent Labs  Lab  09/26/18 1407 09/26/18 1428 09/26/18 2311 09/28/18 0512  NA 142 142  --  140  K 3.7 3.7  --  3.8  CL 110 110  --  106  CO2 22  --   --  25  GLUCOSE 91 88  --  103*  BUN 14 18  --  17  CREATININE 0.42* 0.30* 0.67 0.63  CALCIUM 8.6*  --   --  8.3*   GFR: Estimated Creatinine Clearance: 71.1 mL/min (by C-G formula based on SCr of 0.63 mg/dL). Liver Function Tests: No results for input(s): AST, ALT, ALKPHOS, BILITOT, PROT, ALBUMIN in the last 168 hours. No results for input(s): LIPASE, AMYLASE in the last 168 hours. No results for input(s): AMMONIA in the last 168 hours. Coagulation Profile: No results for input(s): INR, PROTIME in the last 168 hours. Cardiac Enzymes: No results for input(s): CKTOTAL, CKMB, CKMBINDEX, TROPONINI in the last 168 hours. BNP (last 3 results) No results for input(s): PROBNP in the last 8760 hours. HbA1C: No results for input(s): HGBA1C in the last 72 hours. CBG: No results for input(s): GLUCAP in the last 168 hours. Lipid Profile: No results for input(s): CHOL, HDL, LDLCALC, TRIG, CHOLHDL, LDLDIRECT in the last 72 hours. Thyroid Function Tests: No results for input(s): TSH, T4TOTAL, FREET4, T3FREE, THYROIDAB in the last 72 hours. Anemia Panel: No results for input(s): VITAMINB12, FOLATE, FERRITIN, TIBC, IRON, RETICCTPCT in the last 72 hours. Sepsis Labs: No results for input(s): PROCALCITON, LATICACIDVEN in the last 168 hours.  Recent Results (from the past 240 hour(s))  Blood culture (routine x 2)     Status: None (Preliminary result)   Collection Time: 09/26/18  2:07 PM  Result Value Ref Range Status   Specimen Description BLOOD RIGHT HAND  Final   Special Requests   Final    BOTTLES DRAWN AEROBIC AND ANAEROBIC Blood Culture adequate volume   Culture   Final    NO GROWTH 3 DAYS Performed at [facility], [address], [place], [place] [address]    Report Status PENDING  Incomplete  Blood culture (routine x 2)     Status: None  (Preliminary result)   Collection Time: 09/26/18  2:46 PM  Result Value Ref Range Status   Specimen Description BLOOD RIGHT ANTECUBITAL  Final   Special Requests   Final    BOTTLES DRAWN AEROBIC AND ANAEROBIC Blood Culture results may not be optimal due to an inadequate volume of blood received in culture bottles   Culture   Final    NO GROWTH 3 DAYS Performed at [facility], [address], [place], [place] [address]    Report Status PENDING  Incomplete         Radiology Studies: Mr Brain Wo Contrast  Result Date: 09/28/2018 CLINICAL DATA:  Headache with endocarditis. EXAM: MRI HEAD WITHOUT CONTRAST TECHNIQUE: Multiplanar, multiecho pulse sequences of the brain and surrounding structures were obtained without intravenous contrast. COMPARISON:  None. FINDINGS: The study is degraded by motion, despite efforts to reduce this artifact, including utilization of motion-resistant MR sequences. The findings of the study are interpreted in the context of reduced sensitivity/specificity. BRAIN: There  is no acute infarct, acute hemorrhage, hydrocephalus or extra-axial collection. The midline structures are normal. No midline shift or other mass effect. Multifocal white matter hyperintensity, most commonly due to chronic ischemic microangiopathy. The cerebral and cerebellar volume are age-appropriate. Susceptibility-sensitive sequences show no chronic microhemorrhage or superficial siderosis. VASCULAR: Major intracranial arterial and venous sinus flow voids are normal. SKULL AND UPPER CERVICAL SPINE: Calvarial bone marrow signal is normal. There is no skull base mass. Visualized upper cervical spine and soft tissues are normal. SINUSES/ORBITS: No fluid levels or advanced mucosal thickening. No mastoid or middle ear effusion. The orbits are normal. IMPRESSION: Motion degraded examination. No acute intracranial abnormality evident. Electronically Signed   By: Deatra RobinsonKevin  Herman M.D.   On: 09/28/2018 17:37     Mr Thoracic Spine Wo Contrast  Result Date: 09/28/2018 CLINICAL DATA:  Endocarditis with headache and back pain. EXAM: MRI THORACIC AND LUMBAR SPINE WITHOUT CONTRAST TECHNIQUE: Multiplanar and multiecho pulse sequences of the thoracic and lumbar spine were obtained without intravenous contrast. COMPARISON:  None. FINDINGS: The examination had to be discontinued prior to completion due to lack of patient cooperation. Sagittal T2-weighted imaging and STIR sequence were obtained of the thoracic and lumbar spine. MRI THORACIC SPINE FINDINGS Alignment:  Physiologic. Vertebrae: No fracture, evidence of discitis, or bone lesion. Cord:  Normal signal and morphology. Paraspinal and other soft tissues: Negative. Disc levels: The thoracic spinal canal stenosis. There is a disc bulge at C6-7 that narrows the ventral thecal sac. MRI LUMBAR SPINE FINDINGS Segmentation:  Standard. Alignment:  Physiologic. Vertebrae:  No fracture, evidence of discitis, or bone lesion. Conus medullaris and cauda equina: Conus extends to the L1 level. Conus and cauda equina appear normal. Paraspinal and other soft tissues: Negative. Disc levels: No spinal canal stenosis. IMPRESSION: 1. Truncated and motion degraded examination. 2. No epidural collection within the thoracic or lumbar spine. 3. No discitis-osteomyelitis. Electronically Signed   By: Deatra RobinsonKevin  Herman M.D.   On: 09/28/2018 17:42   Mr Lumbar Spine Wo Contrast  Result Date: 09/28/2018 CLINICAL DATA:  Endocarditis with headache and back pain. EXAM: MRI THORACIC AND LUMBAR SPINE WITHOUT CONTRAST TECHNIQUE: Multiplanar and multiecho pulse sequences of the thoracic and lumbar spine were obtained without intravenous contrast. COMPARISON:  None. FINDINGS: The examination had to be discontinued prior to completion due to lack of patient cooperation. Sagittal T2-weighted imaging and STIR sequence were obtained of the thoracic and lumbar spine. MRI THORACIC SPINE FINDINGS Alignment:   Physiologic. Vertebrae: No fracture, evidence of discitis, or bone lesion. Cord:  Normal signal and morphology. Paraspinal and other soft tissues: Negative. Disc levels: The thoracic spinal canal stenosis. There is a disc bulge at C6-7 that narrows the ventral thecal sac. MRI LUMBAR SPINE FINDINGS Segmentation:  Standard. Alignment:  Physiologic. Vertebrae:  No fracture, evidence of discitis, or bone lesion. Conus medullaris and cauda equina: Conus extends to the L1 level. Conus and cauda equina appear normal. Paraspinal and other soft tissues: Negative. Disc levels: No spinal canal stenosis. IMPRESSION: 1. Truncated and motion degraded examination. 2. No epidural collection within the thoracic or lumbar spine. 3. No discitis-osteomyelitis. Electronically Signed   By: Deatra RobinsonKevin  Herman M.D.   On: 09/28/2018 17:42        Scheduled Meds: . buprenorphine-naloxone  1 tablet Sublingual Daily  . cloNIDine  0.1 mg Oral BID  . enoxaparin (LOVENOX) injection  40 mg Subcutaneous QHS  . famotidine  20 mg Oral BID  . feeding supplement (ENSURE ENLIVE)  237 mL Oral  TID BM  . lidocaine  1 patch Transdermal QHS  . multivitamin with minerals  1 tablet Oral Daily  . traZODone  50 mg Oral QHS   Continuous Infusions: . sodium chloride 1,000 mL (09/29/18 0437)  . nafcillin IV 2 g (09/29/18 1149)     LOS: 3 days     Jacquelin Hawking, MD [facility] 09/29/2018, 12:40 PM  If 7PM-7AM, please contact night-coverage [email or web address]

## 2018-09-29 NOTE — Plan of Care (Signed)
  Problem: Education: Goal: Ability to demonstrate management of disease process will improve Outcome: Progressing   Problem: Activity: Goal: Capacity to carry out activities will improve Outcome: Progressing   Problem: Cardiac: Goal: Ability to achieve and maintain adequate cardiopulmonary perfusion will improve Outcome: Progressing   Problem: Education: Goal: Knowledge of General Education information will improve Description Including pain rating scale, medication(s)/side effects and non-pharmacologic comfort measures Outcome: Progressing   Problem: Clinical Measurements: Goal: Ability to maintain clinical measurements within normal limits will improve Outcome: Progressing   Problem: Nutrition: Goal: Adequate nutrition will be maintained Outcome: Progressing   Problem: Coping: Goal: Level of anxiety will decrease Outcome: Progressing   Problem: Pain Managment: Goal: General experience of comfort will improve Outcome: Progressing   Problem: Safety: Goal: Ability to remain free from injury will improve Outcome: Progressing   Problem: Skin Integrity: Goal: Risk for impaired skin integrity will decrease Outcome: Progressing

## 2018-09-30 ENCOUNTER — Inpatient Hospital Stay (HOSPITAL_COMMUNITY): Payer: Medicaid Other

## 2018-09-30 DIAGNOSIS — R7881 Bacteremia: Secondary | ICD-10-CM

## 2018-09-30 DIAGNOSIS — F1910 Other psychoactive substance abuse, uncomplicated: Secondary | ICD-10-CM

## 2018-09-30 DIAGNOSIS — Z720 Tobacco use: Secondary | ICD-10-CM

## 2018-09-30 DIAGNOSIS — F1121 Opioid dependence, in remission: Secondary | ICD-10-CM

## 2018-09-30 DIAGNOSIS — M546 Pain in thoracic spine: Secondary | ICD-10-CM

## 2018-09-30 DIAGNOSIS — K439 Ventral hernia without obstruction or gangrene: Secondary | ICD-10-CM

## 2018-09-30 DIAGNOSIS — R1033 Periumbilical pain: Secondary | ICD-10-CM

## 2018-09-30 MED ORDER — DICLOFENAC SODIUM 1 % TD GEL
2.0000 g | Freq: Four times a day (QID) | TRANSDERMAL | Status: DC
  Administered 2018-09-30: 2 g via TOPICAL
  Filled 2018-09-30: qty 100

## 2018-09-30 MED ORDER — KETOROLAC TROMETHAMINE 30 MG/ML IJ SOLN
15.0000 mg | Freq: Once | INTRAMUSCULAR | Status: AC
  Administered 2018-09-30: 15 mg via INTRAVENOUS
  Filled 2018-09-30: qty 1

## 2018-09-30 MED ORDER — BUPRENORPHINE HCL-NALOXONE HCL 8-2 MG SL SUBL
1.0000 | SUBLINGUAL_TABLET | Freq: Two times a day (BID) | SUBLINGUAL | Status: DC
  Administered 2018-09-30: 1 via SUBLINGUAL
  Filled 2018-09-30: qty 1

## 2018-09-30 NOTE — Plan of Care (Signed)

## 2018-09-30 NOTE — Progress Notes (Signed)
PROGRESS NOTE    Mackenzie Hughes  ZOX:[id number] DOB: [birth year] DOA: 09/26/2018 PCP: Patient, No Pcp Per   Brief Narrative: Mackenzie Hughes is a 38 y.o. 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 She presented secondary to tachycardia and chills. Patient has been in/out of hospitals secondary to MSSA endocarditis/bacteremia and incomplete treatment secondary to frequent AMA discharges. She has been evaluated by cardiothoracic surgery for a heart valve replacement, for which she is not a candidate.   Assessment & Plan:   Active Problems:   Bacteremia due to methicillin susceptible Staphylococcus aureus (MSSA)   Chronic hepatitis C without hepatic coma (HCC)   Endocarditis of mitral valve   Tobacco user   Opiate dependence (HCC)   IV drug abuse (HCC)   Protein-calorie malnutrition, severe   Noncompliance with medication regimen   Acute and subacute bacterial endocarditis   MSSA Bacteremia Mitral valve endocarditis Course complicated by frequent AMA discharges. Blood cultures from 1/6 no growth (final) and recent blood cultures from 1/20 currently no growth to date. -ID recommendations: Nafcillin IV, dental consult  Headache In setting of persistent bacteremia/endocarditis and inadequate treatment. MRI negative for abscess  Back pain In setting of persistent bacteremia. Does not appear to have radiculopathy. MRI thoracic/lumbar spine not significant for abscess or discitis.  -Toradol IV prn -Robaxin -Voltaren gel  History of IV drug abuse Patient states she does not plan to use again. Started on Suboxone inpatient but does not have a means to continue on discharge. -Increase Suboxone to 8-2mg  BID -Clonidine and Atarax to help with withdrawal symptoms -COWS  Abdominal pain Patient with an umbilical hernia. -CT abdomen/pelvis  Right shoulder pain X-ray negative for  fracture  Severe malnutrition -Ensure  Pressure injury Mid coccyx, POA   DVT prophylaxis: Lovenox Code Status:   Code Status: Full Code Family Communication: None at bedside Disposition Plan: Discharge pending ID recommendations for abx duration   Consultants:   Infectious disease  Procedures:   None  Antimicrobials:  Nafcillin    Subjective: Having some worsening abdominal pain. Back pain and shoulder pain also significant. Patient in tears. Having bowel movements. No nausea/vomiting.  Objective: Vitals:   09/29/18 2030 09/30/18 0603 09/30/18 0603 09/30/18 1209  BP: 102/76 105/85 105/85 104/72  Pulse: 81 64 63   Resp: 18 18 18 16   Temp: 98.6 F (37 C) 98.5 F (36.9 C) 98.5 F (36.9 C) 98.7 F (37.1 C)  TempSrc: Oral Oral Oral Oral  SpO2: 95% 100% 97%   Weight:  46.6 kg    Height:        Intake/Output Summary (Last 24 hours) at 09/30/2018 1418 Last data filed at 09/30/2018 1000 Gross per 24 hour  Intake 1200 ml  Output -  Net 1200 ml   Filed Weights   09/27/18 0350 09/29/18 0608 09/30/18 0603  Weight: 45.4 kg 45.9 kg 46.6 kg    Examination:  General exam: Appears calm and comfortable Respiratory system: Clear to auscultation. Respiratory effort normal. Cardiovascular system: S1 & S2 heard, RRR. 2/6 systolic murmur Gastrointestinal system: Abdomen is nondistended, soft and nontender. Easily reducible ventral hernia with no significant tenderness or overlying erythema. Normal bowel sounds heard. Central nervous system: Alert and oriented. No focal neurological deficits. Extremities: No edema. No calf tenderness Skin: No cyanosis. No rashes Psychiatry: Judgement and insight appear normal. Mood & affect appropriate.     Data Reviewed: I have personally reviewed following labs  and imaging studies  CBC: Recent Labs  Lab 09/26/18 1406 09/26/18 1428 09/26/18 2311 09/28/18 0512  WBC 7.8  --  6.9 9.7  NEUTROABS 4.8  --   --   --   HGB 8.5* 10.5*  8.7* 9.2*  HCT 29.6* 31.0* 29.7* 31.1*  MCV 99.7  --  99.3 101.6*  PLT 366  --  362 354   Basic Metabolic Panel: Recent Labs  Lab 09/26/18 1407 09/26/18 1428 09/26/18 2311 09/28/18 0512  NA 142 142  --  140  K 3.7 3.7  --  3.8  CL 110 110  --  106  CO2 22  --   --  25  GLUCOSE 91 88  --  103*  BUN 14 18  --  17  CREATININE 0.42* 0.30* 0.67 0.63  CALCIUM 8.6*  --   --  8.3*   GFR: Estimated Creatinine Clearance: 72.2 mL/min (by C-G formula based on SCr of 0.63 mg/dL). Liver Function Tests: No results for input(s): AST, ALT, ALKPHOS, BILITOT, PROT, ALBUMIN in the last 168 hours. No results for input(s): LIPASE, AMYLASE in the last 168 hours. No results for input(s): AMMONIA in the last 168 hours. Coagulation Profile: No results for input(s): INR, PROTIME in the last 168 hours. Cardiac Enzymes: No results for input(s): CKTOTAL, CKMB, CKMBINDEX, TROPONINI in the last 168 hours. BNP (last 3 results) No results for input(s): PROBNP in the last 8760 hours. HbA1C: No results for input(s): HGBA1C in the last 72 hours. CBG: No results for input(s): GLUCAP in the last 168 hours. Lipid Profile: No results for input(s): CHOL, HDL, LDLCALC, TRIG, CHOLHDL, LDLDIRECT in the last 72 hours. Thyroid Function Tests: No results for input(s): TSH, T4TOTAL, FREET4, T3FREE, THYROIDAB in the last 72 hours. Anemia Panel: No results for input(s): VITAMINB12, FOLATE, FERRITIN, TIBC, IRON, RETICCTPCT in the last 72 hours. Sepsis Labs: No results for input(s): PROCALCITON, LATICACIDVEN in the last 168 hours.  Recent Results (from the past 240 hour(s))  Blood culture (routine x 2)     Status: None (Preliminary result)   Collection Time: 09/26/18  2:07 PM  Result Value Ref Range Status   Specimen Description BLOOD RIGHT HAND  Final   Special Requests   Final    BOTTLES DRAWN AEROBIC AND ANAEROBIC Blood Culture adequate volume   Culture   Final    NO GROWTH 3 DAYS Performed at [facility], [address] [place], [place] [address]    Report Status PENDING  Incomplete  Blood culture (routine x 2)     Status: None (Preliminary result)   Collection Time: 09/26/18  2:46 PM  Result Value Ref Range Status   Specimen Description BLOOD RIGHT ANTECUBITAL  Final   Special Requests   Final    BOTTLES DRAWN AEROBIC AND ANAEROBIC Blood Culture results may not be optimal due to an inadequate volume of blood received in culture bottles   Culture   Final    NO GROWTH 3 DAYS Performed at [facility], [address] [place], [place] [address]    Report Status PENDING  Incomplete         Radiology Studies: Mr Brain Wo Contrast  Result Date: 09/28/2018 CLINICAL DATA:  Headache with endocarditis. EXAM: MRI HEAD WITHOUT CONTRAST TECHNIQUE: Multiplanar, multiecho pulse sequences of the brain and surrounding structures were obtained without intravenous contrast. COMPARISON:  None. FINDINGS: The study is degraded by motion, despite efforts to reduce this artifact, including utilization of  motion-resistant MR sequences. The findings of the study are interpreted in the context of reduced sensitivity/specificity. BRAIN: There is no acute infarct, acute hemorrhage, hydrocephalus or extra-axial collection. The midline structures are normal. No midline shift or other mass effect. Multifocal white matter hyperintensity, most commonly due to chronic ischemic microangiopathy. The cerebral and cerebellar volume are age-appropriate. Susceptibility-sensitive sequences show no chronic microhemorrhage or superficial siderosis. VASCULAR: Major intracranial arterial and venous sinus flow voids are normal. SKULL AND UPPER CERVICAL SPINE: Calvarial bone marrow signal is normal. There is no skull base mass. Visualized upper cervical spine and soft tissues are normal. SINUSES/ORBITS: No fluid levels or advanced mucosal thickening. No mastoid or middle ear effusion. The orbits are normal. IMPRESSION:  Motion degraded examination. No acute intracranial abnormality evident. Electronically Signed   By: Deatra RobinsonKevin  Herman M.D.   On: 09/28/2018 17:37   Mr Thoracic Spine Wo Contrast  Result Date: 09/28/2018 CLINICAL DATA:  Endocarditis with headache and back pain. EXAM: MRI THORACIC AND LUMBAR SPINE WITHOUT CONTRAST TECHNIQUE: Multiplanar and multiecho pulse sequences of the thoracic and lumbar spine were obtained without intravenous contrast. COMPARISON:  None. FINDINGS: The examination had to be discontinued prior to completion due to lack of patient cooperation. Sagittal T2-weighted imaging and STIR sequence were obtained of the thoracic and lumbar spine. MRI THORACIC SPINE FINDINGS Alignment:  Physiologic. Vertebrae: No fracture, evidence of discitis, or bone lesion. Cord:  Normal signal and morphology. Paraspinal and other soft tissues: Negative. Disc levels: The thoracic spinal canal stenosis. There is a disc bulge at C6-7 that narrows the ventral thecal sac. MRI LUMBAR SPINE FINDINGS Segmentation:  Standard. Alignment:  Physiologic. Vertebrae:  No fracture, evidence of discitis, or bone lesion. Conus medullaris and cauda equina: Conus extends to the L1 level. Conus and cauda equina appear normal. Paraspinal and other soft tissues: Negative. Disc levels: No spinal canal stenosis. IMPRESSION: 1. Truncated and motion degraded examination. 2. No epidural collection within the thoracic or lumbar spine. 3. No discitis-osteomyelitis. Electronically Signed   By: Deatra RobinsonKevin  Herman M.D.   On: 09/28/2018 17:42   Mr Lumbar Spine Wo Contrast  Result Date: 09/28/2018 CLINICAL DATA:  Endocarditis with headache and back pain. EXAM: MRI THORACIC AND LUMBAR SPINE WITHOUT CONTRAST TECHNIQUE: Multiplanar and multiecho pulse sequences of the thoracic and lumbar spine were obtained without intravenous contrast. COMPARISON:  None. FINDINGS: The examination had to be discontinued prior to completion due to lack of patient cooperation.  Sagittal T2-weighted imaging and STIR sequence were obtained of the thoracic and lumbar spine. MRI THORACIC SPINE FINDINGS Alignment:  Physiologic. Vertebrae: No fracture, evidence of discitis, or bone lesion. Cord:  Normal signal and morphology. Paraspinal and other soft tissues: Negative. Disc levels: The thoracic spinal canal stenosis. There is a disc bulge at C6-7 that narrows the ventral thecal sac. MRI LUMBAR SPINE FINDINGS Segmentation:  Standard. Alignment:  Physiologic. Vertebrae:  No fracture, evidence of discitis, or bone lesion. Conus medullaris and cauda equina: Conus extends to the L1 level. Conus and cauda equina appear normal. Paraspinal and other soft tissues: Negative. Disc levels: No spinal canal stenosis. IMPRESSION: 1. Truncated and motion degraded examination. 2. No epidural collection within the thoracic or lumbar spine. 3. No discitis-osteomyelitis. Electronically Signed   By: Deatra RobinsonKevin  Herman M.D.   On: 09/28/2018 17:42        Scheduled Meds: . buprenorphine-naloxone  1 tablet Sublingual BID  . cloNIDine  0.1 mg Oral BID  . enoxaparin (LOVENOX) injection  40 mg Subcutaneous QHS  .  famotidine  20 mg Oral BID  . feeding supplement (ENSURE ENLIVE)  237 mL Oral TID BM  . lidocaine  1 patch Transdermal QHS  . multivitamin with minerals  1 tablet Oral Daily  . traZODone  50 mg Oral QHS   Continuous Infusions: . sodium chloride 1,000 mL (09/29/18 0437)  . nafcillin IV 2 g (09/30/18 1136)     LOS: 4 days     Jacquelin Hawking, MD [facility] 09/30/2018, 2:18 PM  If 7PM-7AM, please contact night-coverage [email or web address]

## 2018-09-30 NOTE — Progress Notes (Signed)
Pharmacy Antibiotic Note  Mackenzie Hughes is a 38 y.o. female admitted on 09/26/2018 with MSSA endocarditis.  Pharmacy consulted 1/20 for Nafcillin dosing.  Pt recently here and being treated for MSSA endocarditis but left AMA on 09/21/18. Plan was for IV Nafcillin for 6 weeks per ID recs - last date of antibiotics was planned to be 2/18.  Thus Nafcillin 2 g IV q4 hour resumed on admit 09/26/18.  WBC remains wnl, afebrile, SCr 0.63 on 1/22, remains wnl. CrCl 72 ml/min.  Blood Cx's 1/6 no growth (final) and repeat Bcx's 1/20 ngtd.  ID following: ID gave dosed patient with Oritavancin in the event she decides to leave AMA. ID plans to  check CRP/ESR next week with interruption of therapy to see if this helps give [place] information regarding treatment progress.   Plan: Continue Nafcillin 2gm IV q4h. ID is following. Will f/u renal function and pt's clinical condition.  Height: 5' 5"  (165.1 cm) Weight: 102 lb 11.2 oz (46.6 kg) IBW/kg (Calculated) : 57  Temp (24hrs), Avg:98.6 F (37 C), Min:98.5 F (36.9 C), Max:98.7 F (37.1 C)  Recent Labs  Lab 09/26/18 1406 09/26/18 1407 09/26/18 1428 09/26/18 2311 09/28/18 0512  WBC 7.8  --   --  6.9 9.7  CREATININE  --  0.42* 0.30* 0.67 0.63    Estimated Creatinine Clearance: 72.2 mL/min (by C-G formula based on SCr of 0.63 mg/dL).    No Known Allergies  Antimicrobials this admission: 1/20 Nafcillin >> plan 2/18 per ID recs  Microbiology results: 1/20 repeat BCx x2: ngtd  Thank you for allowing pharmacy to be a part of this patient's care.  Nicole Cella, RPh Clinical pharmacist [phone] **Pharmacist phone directory can now be found on [email or web address] ([email or web address]).  Listed under [place]. 09/30/2018 2:07 PM

## 2018-09-30 NOTE — Progress Notes (Signed)
Infectious Disease Note:   Chart reviewed for Mackenzie Hughes. No changes to plan of care aside from continuation of Nafcillin as planned for left sided MSSA endocarditis with peripheral and CNS emboli. She has been dosed with Oritavancin in the event she decides to leave AMA.   Appreciate Chaplain team to attempt to support her while here.   Would check weekly CBC/BMP. Will check CRP/ESR next week with interruption of therapy to see if this helps give [place] information regarding treatment progress.   For any ID questions or concerns Dr. Baxter Flattery will be available @ [phone]   We will follow up again early next week to check on Mackenzie Hughes.   Janene Madeira, MSN, NP-C [facility] [email or web address] Pager: [phone]

## 2018-09-30 NOTE — Progress Notes (Signed)
I completed a follow up visit with the patient. I introduced myself as the Chaplain for her unit and that I was available to provide any spiritual support she requested. She did not want to talk with me at this time. I shared that I am available if needed or requested at a later time.    09/30/18 0900  Clinical Encounter Type  Visited With Patient  Visit Type Follow-up;Spiritual support  Referral From Nurse  Consult/Referral To Chaplain  Spiritual Encounters  Spiritual Needs Prayer  Stress Factors  Patient Stress Factors Exhausted     Chaplain Dr Melvyn Novas

## 2018-10-01 LAB — CULTURE, BLOOD (ROUTINE X 2)
Culture: NO GROWTH
Culture: NO GROWTH
SPECIAL REQUESTS: ADEQUATE

## 2018-10-01 NOTE — Progress Notes (Signed)
Pt midline removed, catheter intact  Pt non telemetry  MD aware pt signed AMA  Pt packing up all belongings, pt family at bedside  Pt states she left her shoes in CT  Called CT, technician stated they did not have any lost belongings

## 2018-10-01 NOTE — Progress Notes (Signed)
Pt and pt family ambulated off unit with all belongings

## 2018-10-01 NOTE — Progress Notes (Signed)
Pt calls nurses station, stating "I am ready to leave" RN goes to room, pt states she wants to sign out AMA  Pt crying upset over pain treatment and management  Paged MD Mal Misty  MD Mal Misty spoke to pt via phone

## 2018-10-01 NOTE — Discharge Summary (Signed)
Physician Discharge Summary  April HoldingCarla T Biegler ZOX:[id number] DOB: 05-Nov-1980 DOA: 09/26/2018  PCP: Patient, No Pcp Per  Admit date: 09/26/2018 Discharge date: 10/01/2018  Admitted From: Home Disposition: AMA  Recommendations for Outpatient Follow-up:  1. Left AMA  Brief/Interim Summary:  Admission HPI written by Myrtie NeitherNwannadiya Ugah, MD   Chief Complaint: Bacterial endocarditis  HPI: April HoldingCarla T Burks is a 38 y.o. 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  Her blood culture was still positive as of December 17,019 and she had a septic emboli as well as cerebral abscess and renal and splenic infarctions she was [facility], 2018 and had UDS positive for amphetamine and opiate at that time.  She was recently admitted 3 days ago at [facility] and left AGAINST MEDICAL ADVICE because she did not get IV narcotic.  She was supposed to have stayed until October 25, 2018 for IV antibiotics for her endocarditis but she left AMA.  She returns to the emergency room this afternoon because of her heart rate that is increased as well as feeling hot and cold.  She is complaining of right shoulder pain she denies any injury.  She denies any chest pain or shortness of breath.    ED Course: Blood culture obtained, started on nafcillin IV antibiotics    Hospital course:  MSSA Bacteremia Mitral valve endocarditis Course complicated by frequent AMA discharges. Blood cultures from 1/6 no growth (final) and recent blood cultures from 1/20 currently no growth to date. ID recommended Nafcillin IV and dental consult. Patient also received one dose of oritavancin IV on 09/27/18. Dental consult not completed prior to  patient leaving AMA.  Headache In setting of persistent bacteremia/endocarditis and inadequate treatment. MRI negative for abscess  Back pain In setting of persistent bacteremia. Does not appear to have radiculopathy. MRI thoracic/lumbar spine not significant for abscess or discitis. Toradol, robaxin and voltaren gel.   History of IV drug abuse Patient states she does not plan to use again. Started on Suboxone inpatient but does not have a means to continue on discharge at this time. Increased to a maintenance dose of Suboxone 8-2 mg BID. Clonidine and atarax for withdrawal symptoms. Low COWS scores.  Abdominal pain Patient with an umbilical hernia. CT abdomen/pelvis obtained on 1/24 and significant for new hepatic cyst and diffuse mesenteric edema. Patient left AMA before results could be discussed.  Right shoulder pain X-ray negative for fracture. Toradol. Discussed with patient to reevaluate today, however patient said she is leaving AMA.  Severe malnutrition Ensure  Pressure injury Mid coccyx, POA  Discharge Diagnoses:  Active Problems:   Bacteremia due to methicillin susceptible Staphylococcus aureus (MSSA)   Chronic hepatitis C without hepatic coma (HCC)   Endocarditis of mitral valve   Tobacco user   Opiate dependence (HCC)   IV drug abuse (HCC)   Protein-calorie malnutrition, severe   Noncompliance with medication regimen   Acute and subacute bacterial endocarditis     Follow-up Information    Cliffton Astersampbell, John, MD Follow up on 11/08/2018.   Specialty:  Infectious Diseases Why:  10:30 am appointment  Contact information: [address] [place] [address] [phone]  No Known Allergies  Consultations:  Infectious disease   Procedures/Studies: Ct Abdomen Pelvis Wo Contrast  Result Date: 09/30/2018 CLINICAL DATA:  Ongoing abdominal pain and distension. EXAM: CT ABDOMEN AND PELVIS WITHOUT CONTRAST TECHNIQUE: Multidetector  CT imaging of the abdomen and pelvis was performed following the standard protocol without IV contrast. COMPARISON:  CT scan 03/03/2018 FINDINGS: Lower chest: Cardiac enlargement and small bilateral pleural effusions. Vascular congestion and possible mild edema. Hepatobiliary: No focal hepatic lesions are identified without contrast. No biliary dilatation. Gallbladder appears contracted. No obvious common bile duct dilatation. Pancreas: No obvious mass or inflammation without contrast but poor visualization. Spleen: The spleen is enlarged and there is a large new cystic area measuring 10 x 9 x 6 cm. It measures 17 Hounsfield units which suggests a cyst but it was not present on the prior CT scan from 6 months ago. It could be a posttraumatic cyst or possibly a pseudocyst. Adrenals/Urinary Tract: The adrenal glands and kidneys are grossly normal without contrast. No renal calculi or hydronephrosis. The bladder is grossly normal. Stomach/Bowel: There is contrast scattered throughout the small bowel. No obstruction or mass is identified. Vascular/Lymphatic: No atherosclerotic calcifications or aneurysm. No gross adenopathy. Reproductive: The uterus and ovaries are grossly normal. Other: Mesenteric edema without overt ascites. There is also diffuse body wall edema suggesting anasarca. Musculoskeletal: No significant bony findings. IMPRESSION: 1. Diffuse body wall edema, small bilateral pleural effusions and mesenteric edema suggesting anasarca. 2. New large hepatic cyst since prior CT from June 2019. It could be posttraumatic or possibly a pseudocyst. 3. No obvious acute abdominal/pelvic findings or adenopathy but study is limited due to lack of IV contrast, very little intraperitoneal fat and diffuse mesenteric edema. Electronically Signed   By: Rudie Meyer M.D.   On: 09/30/2018 14:44   Dg Tibia/fibula Left  Result Date: 09/12/2018 CLINICAL DATA:  Bilateral leg pain EXAM: LEFT TIBIA AND FIBULA - 2 VIEW COMPARISON:   None. FINDINGS: There is no evidence of fracture or other focal bone lesions. Soft tissues are unremarkable. No radiographic changes of osteomyelitis. IMPRESSION: Negative. Electronically Signed   By: Charlett Nose M.D.   On: 09/12/2018 12:26   Dg Tibia/fibula Right  Result Date: 09/12/2018 CLINICAL DATA:  Bilateral leg pain EXAM: RIGHT TIBIA AND FIBULA - 2 VIEW COMPARISON:  None. FINDINGS: There is no evidence of fracture or other focal bone lesions. Soft tissues are unremarkable. No radiographic changes of osteomyelitis. IMPRESSION: Negative. Electronically Signed   By: Charlett Nose M.D.   On: 09/12/2018 12:26   Ct Angio Chest Pe W And/or Wo Contrast  Result Date: 09/26/2018 CLINICAL DATA:  Chest palpitations and right shoulder pain in an IV drug abuser. History of endocarditis. EXAM: CT ANGIOGRAPHY CHEST WITH CONTRAST TECHNIQUE: Multidetector CT imaging of the chest was performed using the standard protocol during bolus administration of intravenous contrast. Multiplanar CT image reconstructions and MIPs were obtained to evaluate the vascular anatomy. CONTRAST:  75 mL ISOVUE-370 IOPAMIDOL (ISOVUE-370) INJECTION 76% COMPARISON:  CT chest 07/26/2018. Single view of the chest 09/23/2018 and 09/24/2018. FINDINGS: Cardiovascular: No pulmonary embolus is identified. There is cardiomegaly. No pericardial effusion. No aneurysm. No atherosclerotic vascular disease identified. Mediastinum/Nodes: No enlarged mediastinal, hilar, or axillary lymph nodes. Thyroid gland, trachea, and esophagus demonstrate no significant findings. Lungs/Pleura: Very small right pleural effusion is seen. Focal airspace disease is seen in the periphery of the right middle lobe. Peribronchial thickening is noted. Dependent atelectasis is seen. There is some emphysematous disease. Upper Abdomen:  Negative. Musculoskeletal: Negative. Review of the MIP images confirms the above findings. IMPRESSION: Negative for pulmonary embolus. Focal airspace  disease in the periphery of the right middle lobe has an appearance most worrisome for pneumonia. Peribronchial thickening compatible with bronchitis also noted. Marked cardiomegaly. Emphysema (ICD10-J43.9). Electronically Signed   By: Drusilla Kannerhomas  Dalessio M.D.   On: 09/26/2018 16:21   Mr Brain Wo Contrast  Result Date: 09/28/2018 CLINICAL DATA:  Headache with endocarditis. EXAM: MRI HEAD WITHOUT CONTRAST TECHNIQUE: Multiplanar, multiecho pulse sequences of the brain and surrounding structures were obtained without intravenous contrast. COMPARISON:  None. FINDINGS: The study is degraded by motion, despite efforts to reduce this artifact, including utilization of motion-resistant MR sequences. The findings of the study are interpreted in the context of reduced sensitivity/specificity. BRAIN: There is no acute infarct, acute hemorrhage, hydrocephalus or extra-axial collection. The midline structures are normal. No midline shift or other mass effect. Multifocal white matter hyperintensity, most commonly due to chronic ischemic microangiopathy. The cerebral and cerebellar volume are age-appropriate. Susceptibility-sensitive sequences show no chronic microhemorrhage or superficial siderosis. VASCULAR: Major intracranial arterial and venous sinus flow voids are normal. SKULL AND UPPER CERVICAL SPINE: Calvarial bone marrow signal is normal. There is no skull base mass. Visualized upper cervical spine and soft tissues are normal. SINUSES/ORBITS: No fluid levels or advanced mucosal thickening. No mastoid or middle ear effusion. The orbits are normal. IMPRESSION: Motion degraded examination. No acute intracranial abnormality evident. Electronically Signed   By: Deatra RobinsonKevin  Herman M.D.   On: 09/28/2018 17:37   Mr Cervical Spine Wo Contrast  Result Date: 09/21/2018 CLINICAL DATA:  Endocarditis, IV drug abuse.  Neck pain EXAM: MRI CERVICAL SPINE WITHOUT CONTRAST TECHNIQUE: Multiplanar, multisequence MR imaging of the cervical  spine was performed. No intravenous contrast was administered. COMPARISON:  MRI cervical spine 03/07/2018 FINDINGS: Alignment: Image quality degraded by extensive motion. Normal alignment.  Mild kyphosis C6-7 similar to the prior MRI. Vertebrae: Negative for fracture or mass.  No bone marrow edema Cord: Limited cord evaluation due to motion.  No gross abnormality Posterior Fossa, vertebral arteries, paraspinal tissues: Negative for mass or fluid collection. No soft tissue edema Air-fluid levels in the maxillary sinus bilaterally. Disc levels: Mild disc degeneration and spurring at C6-7 as noted on the prior study. No significant spinal stenosis. No evidence of discitis/osteomyelitis on  motion degraded imaging. IMPRESSION: Image quality degraded by significant motion No acute abnormality identified. Air-fluid level in bilateral maxillary sinus. Electronically Signed   By: Marlan Palauharles  Clark M.D.   On: 09/21/2018 14:12   Mr Thoracic Spine Wo Contrast  Result Date: 09/28/2018 CLINICAL DATA:  Endocarditis with headache and back pain. EXAM: MRI THORACIC AND LUMBAR SPINE WITHOUT CONTRAST TECHNIQUE: Multiplanar and multiecho pulse sequences of the thoracic and lumbar spine were obtained without intravenous contrast. COMPARISON:  None. FINDINGS: The examination had to be discontinued prior to completion due to lack of patient cooperation. Sagittal T2-weighted imaging and STIR sequence were obtained of the thoracic and lumbar spine. MRI THORACIC SPINE FINDINGS Alignment:  Physiologic. Vertebrae: No fracture, evidence of discitis, or bone lesion. Cord:  Normal signal and morphology. Paraspinal and other soft tissues: Negative. Disc levels: The thoracic spinal canal stenosis. There is a disc bulge at C6-7 that narrows the ventral thecal sac. MRI LUMBAR SPINE FINDINGS Segmentation:  Standard. Alignment:  Physiologic. Vertebrae:  No fracture, evidence of discitis, or bone lesion. Conus medullaris and cauda equina: Conus extends  to the L1 level. Conus and cauda equina appear normal. Paraspinal  and other soft tissues: Negative. Disc levels: No spinal canal stenosis. IMPRESSION: 1. Truncated and motion degraded examination. 2. No epidural collection within the thoracic or lumbar spine. 3. No discitis-osteomyelitis. Electronically Signed   By: Deatra Robinson M.D.   On: 09/28/2018 17:42   Mr Lumbar Spine Wo Contrast  Result Date: 09/28/2018 CLINICAL DATA:  Endocarditis with headache and back pain. EXAM: MRI THORACIC AND LUMBAR SPINE WITHOUT CONTRAST TECHNIQUE: Multiplanar and multiecho pulse sequences of the thoracic and lumbar spine were obtained without intravenous contrast. COMPARISON:  None. FINDINGS: The examination had to be discontinued prior to completion due to lack of patient cooperation. Sagittal T2-weighted imaging and STIR sequence were obtained of the thoracic and lumbar spine. MRI THORACIC SPINE FINDINGS Alignment:  Physiologic. Vertebrae: No fracture, evidence of discitis, or bone lesion. Cord:  Normal signal and morphology. Paraspinal and other soft tissues: Negative. Disc levels: The thoracic spinal canal stenosis. There is a disc bulge at C6-7 that narrows the ventral thecal sac. MRI LUMBAR SPINE FINDINGS Segmentation:  Standard. Alignment:  Physiologic. Vertebrae:  No fracture, evidence of discitis, or bone lesion. Conus medullaris and cauda equina: Conus extends to the L1 level. Conus and cauda equina appear normal. Paraspinal and other soft tissues: Negative. Disc levels: No spinal canal stenosis. IMPRESSION: 1. Truncated and motion degraded examination. 2. No epidural collection within the thoracic or lumbar spine. 3. No discitis-osteomyelitis. Electronically Signed   By: Deatra Robinson M.D.   On: 09/28/2018 17:42   Mr Shoulder Right Wo Contrast  Result Date: 09/21/2018 CLINICAL DATA:  Right shoulder pain. EXAM: MRI OF THE RIGHT SHOULDER WITHOUT CONTRAST TECHNIQUE: Multiplanar, multisequence MR imaging of the  shoulder was performed. No intravenous contrast was administered. COMPARISON:  MRI dated 03/05/2018 FINDINGS: Rotator cuff:  Intact. Muscles: No atrophy or edema in the muscles. There is some edema in the soft tissues around the muscles of the rotator cuff extending onto the chest wall. Biceps long head:  Properly located and intact. Acromioclavicular Joint:  Normal.  Type 1 acromion.  No bursitis. Glenohumeral Joint: Minimal effusion.  No chondral defect. Labrum:  Intact. Bones: No evidence of osteomyelitis or fracture or other significant bone abnormality. Other: None IMPRESSION: 1. Minimal glenohumeral joint effusion, almost completely resolved since the prior MRI. 2. Slight residual edema in the subcutaneous fat around the shoulder joint, markedly diminished since the prior study. 3. No discrete osteomyelitis or soft tissue abscesses. 4. Intact rotator cuff.  The Electronically Signed   By: Francene Boyers M.D.   On: 09/21/2018 14:47   Vas [place] Lower Extremity Venous (dvt)  Result Date: 09/14/2018  Lower Venous Study Indications: Pain.  Performing Technologist: Jeb Levering RDMS, RVT  Examination Guidelines: A complete evaluation includes B-mode imaging, spectral Doppler, color Doppler, and power Doppler as needed of all accessible portions of each vessel. Bilateral testing is considered an integral part of a complete examination. Limited examinations for reoccurring indications may be performed as noted.  Right Venous Findings: +---------+---------------+---------+-----------+----------+-------+          CompressibilityPhasicitySpontaneityPropertiesSummary +---------+---------------+---------+-----------+----------+-------+ CFV      Full           Yes      Yes                          +---------+---------------+---------+-----------+----------+-------+ SFJ      Full                                                 +---------+---------------+---------+-----------+----------+-------+  FV Prox   Full                                                 +---------+---------------+---------+-----------+----------+-------+ FV Mid   Full                                                 +---------+---------------+---------+-----------+----------+-------+ FV DistalFull                                                 +---------+---------------+---------+-----------+----------+-------+ PFV      Full                                                 +---------+---------------+---------+-----------+----------+-------+ POP      Full           Yes      Yes                          +---------+---------------+---------+-----------+----------+-------+ PTV      Full                                                 +---------+---------------+---------+-----------+----------+-------+ PERO     Full                                                 +---------+---------------+---------+-----------+----------+-------+  Left Venous Findings: +---------+---------------+---------+-----------+----------+-------+          CompressibilityPhasicitySpontaneityPropertiesSummary +---------+---------------+---------+-----------+----------+-------+ CFV      Full           Yes      Yes                          +---------+---------------+---------+-----------+----------+-------+ SFJ      Full                                                 +---------+---------------+---------+-----------+----------+-------+ FV Prox  Full                                                 +---------+---------------+---------+-----------+----------+-------+ FV Mid   Full                                                 +---------+---------------+---------+-----------+----------+-------+  FV DistalFull                                                 +---------+---------------+---------+-----------+----------+-------+ PFV      Full                                                  +---------+---------------+---------+-----------+----------+-------+ POP      Full           Yes      Yes                          +---------+---------------+---------+-----------+----------+-------+ PTV      Full                                                 +---------+---------------+---------+-----------+----------+-------+ PERO     Full                                                 +---------+---------------+---------+-----------+----------+-------+    Summary: Right: There is no evidence of deep vein thrombosis in the lower extremity. No cystic structure found in the popliteal fossa. Left: There is no evidence of deep vein thrombosis in the lower extremity. No cystic structure found in the popliteal fossa.  *See table(s) above for measurements and observations. Electronically signed by Coral Else MD on 09/14/2018 at 6:00:00 PM.    Final    [place] Ekg Site Rite  Result Date: 09/15/2018 If Site Rite image not attached, placement could not be confirmed due to current cardiac rhythm.     Subjective: Patient states she is leaving AMA.  Discharge Exam: Vitals:   09/30/18 2234 10/01/18 0454  BP: (!) 116/94 107/85  Pulse: 71 61  Resp:  18  Temp: 98.1 F (36.7 C) 98.5 F (36.9 C)  SpO2: 98% 96%   Vitals:   09/30/18 1953 09/30/18 2234 10/01/18 0300 10/01/18 0454  BP: (!) 113/93 (!) 116/94  107/85  Pulse: 71 71  61  Resp: 18   18  Temp: 98.9 F (37.2 C) 98.1 F (36.7 C)  98.5 F (36.9 C)  TempSrc: Oral Oral  Oral  SpO2: 94% 98%  96%  Weight:   47.6 kg   Height:         The results of significant diagnostics from this hospitalization (including imaging, microbiology, ancillary and laboratory) are listed below for reference.     Microbiology: Recent Results (from the past 240 hour(s))  Blood culture (routine x 2)     Status: None (Preliminary result)   Collection Time: 09/26/18  2:07 PM  Result Value Ref Range Status   Specimen Description BLOOD RIGHT HAND   Final   Special Requests   Final    BOTTLES DRAWN AEROBIC AND ANAEROBIC Blood Culture adequate volume   Culture   Final    NO GROWTH 4 DAYS Performed at [facility], [address], [place], [place] [address]    Report Status PENDING  Incomplete  Blood culture (routine x 2)     Status: None (Preliminary result)   Collection Time: 09/26/18  2:46 PM  Result Value Ref Range Status   Specimen Description BLOOD RIGHT ANTECUBITAL  Final   Special Requests   Final    BOTTLES DRAWN AEROBIC AND ANAEROBIC Blood Culture results may not be optimal due to an inadequate volume of blood received in culture bottles   Culture   Final    NO GROWTH 4 DAYS Performed at [facility], [address] [place], [place] [address]    Report Status PENDING  Incomplete     Labs: BNP (last 3 results) No results for input(s): BNP in the last 8760 hours. Basic Metabolic Panel: Recent Labs  Lab 09/26/18 1407 09/26/18 1428 09/26/18 2311 09/28/18 0512  NA 142 142  --  140  K 3.7 3.7  --  3.8  CL 110 110  --  106  CO2 22  --   --  25  GLUCOSE 91 88  --  103*  BUN 14 18  --  17  CREATININE 0.42* 0.30* 0.67 0.63  CALCIUM 8.6*  --   --  8.3*   Liver Function Tests: No results for input(s): AST, ALT, ALKPHOS, BILITOT, PROT, ALBUMIN in the last 168 hours. No results for input(s): LIPASE, AMYLASE in the last 168 hours. No results for input(s): AMMONIA in the last 168 hours. CBC: Recent Labs  Lab 09/26/18 1406 09/26/18 1428 09/26/18 2311 09/28/18 0512  WBC 7.8  --  6.9 9.7  NEUTROABS 4.8  --   --   --   HGB 8.5* 10.5* 8.7* 9.2*  HCT 29.6* 31.0* 29.7* 31.1*  MCV 99.7  --  99.3 101.6*  PLT 366  --  362 354   Cardiac Enzymes: No results for input(s): CKTOTAL, CKMB, CKMBINDEX, TROPONINI in the last 168 hours. BNP: Invalid input(s): POCBNP CBG: No results for input(s): GLUCAP in the last 168 hours. D-Dimer No results for input(s): DDIMER in the last 72 hours. Hgb A1c No results for  input(s): HGBA1C in the last 72 hours. Lipid Profile No results for input(s): CHOL, HDL, LDLCALC, TRIG, CHOLHDL, LDLDIRECT in the last 72 hours. Thyroid function studies No results for input(s): TSH, T4TOTAL, T3FREE, THYROIDAB in the last 72 hours.  Invalid input(s): FREET3 Anemia work up No results for input(s): VITAMINB12, FOLATE, FERRITIN, TIBC, IRON, RETICCTPCT in the last 72 hours. Urinalysis    Component Value Date/Time   COLORURINE AMBER (A) 09/12/2018 0344   APPEARANCEUR HAZY (A) 09/12/2018 0344   LABSPEC 1.032 (H) 09/12/2018 0344   PHURINE 5.0 09/12/2018 0344   GLUCOSEU NEGATIVE 09/12/2018 0344   HGBUR NEGATIVE 09/12/2018 0344   BILIRUBINUR SMALL (A) 09/12/2018 0344   KETONESUR NEGATIVE 09/12/2018 0344   PROTEINUR 100 (A) 09/12/2018 0344   NITRITE NEGATIVE 09/12/2018 0344   LEUKOCYTESUR MODERATE (A) 09/12/2018 0344   Sepsis Labs Invalid input(s): PROCALCITONIN,  WBC,  LACTICIDVEN Microbiology Recent Results (from the past 240 hour(s))  Blood culture (routine x 2)     Status: None (Preliminary result)   Collection Time: 09/26/18  2:07 PM  Result Value Ref Range Status   Specimen Description BLOOD RIGHT HAND  Final   Special Requests   Final    BOTTLES DRAWN AEROBIC AND ANAEROBIC Blood Culture adequate volume   Culture   Final    NO GROWTH 4 DAYS Performed at [facility], [address], [place], [place] [address]    Report Status PENDING  Incomplete  Blood culture (routine x 2)     Status: None (Preliminary result)   Collection Time: 09/26/18  2:46 PM  Result Value Ref Range Status   Specimen Description BLOOD RIGHT ANTECUBITAL  Final   Special Requests   Final    BOTTLES DRAWN AEROBIC AND ANAEROBIC Blood Culture results may not be optimal due to an inadequate volume of blood received in culture bottles   Culture   Final    NO GROWTH 4 DAYS Performed at [facility], [address] [place], [place] [address]    Report Status PENDING  Incomplete       SIGNED:   Jacquelin Hawking, MD [facility] 10/01/2018, 7:50 AM

## 2018-10-02 NOTE — Discharge Summary (Signed)
Physician Discharge Summary  Mackenzie Hughes EGB:[id number] DOB: [birth year] DOA: 09/12/2018  PCP: Patient, No Pcp Per  Admit date: 09/12/2018 Discharge date: 09/21/2018  Recommendations for Outpatient Follow-up:  1. Left AGAINST MEDICAL ADVICE   Discharge Diagnoses:  Principal Problem:   Bacteremia due to methicillin susceptible Staphylococcus aureus (MSSA) Active Problems:   Endocarditis of mitral valve   Tobacco user   Opiate dependence (HCC)   IV drug abuse (HCC)   Myositis   Cerebritis   Protein-calorie malnutrition, severe   Discharge Condition:   Diet recommendation:   Filed Weights   09/12/18 1543 09/14/18 0433  Weight: 41.9 kg 46.3 kg    History of present illness:  Pt. with PMH of IVDA; Hep C; remote ovarian CA; Wernicke's encephalopathy (7/19); and MRSA mitral valve endocarditis; admitted on 09/12/2018, presented with complaint of leg pain, was found to have ongoing MRSA endocarditis. admitted at [facility] and left [facility] 12/10; was admitted at [facility] 12/11 and left AMA 12/20. [facility] again on 09/07/18 left AMA. septic pulmonary emboli as well as cerebral abscesses and renal and splenic infarctions. UDS in outside hospital was positive for amphetamines and opiates  Hospital Course:   Recent MRSA bacteremia with endocarditis -08/17/18 Echo outside hospital with "large echodensity (1.5 x 2.0 cm) on the posterior mitral valve leaflet consistent with a vegetation with severe (4+) mitral regurgitation -Blood cultures -most recently positive as of 08/23/18 as well as 08/17/18 with MSSA resistant to Clinda and Erythromycin, previously in July 2019 was MRSA -Blood cultures now from 12/27 were negative. -Evaluated by [facility] Dr. Laneta Simmers, not felt to be a candidate for valve replacement at this time would completely agree with this assessment, he recommended follow-up after she completed antibiotic course and remained drug-free if she develops symptoms from heart failure -Continue IV  Nacillin for 6weeks per ID recs -Last date of antibiotics is 2/18, ID also recommended oral surgery evaluation at some point -Left AMA on 1/15  Right shoulder and neck pain -Due to above comorbidities ordered MRI C-spine and shoulder -Pt refused MRI x2, and reported pain had much improved however since 1/13 afternoon cortically worsened again, range of motion is limited,  -Reordered MRI, pending at this time  -Left AMA  IV drug abuse with heroin dependence -Long-standing heroin dependence, has left AMA 3 times since July at outside hospitals -Started on Suboxone, per protocol cut down dose to BID per pt request  Wernicke encephalopathy -Patient previously diagnosed with Wernicke's -She was treated with IV thiamine, although it is not clear that she completed her entire course  -Given high-dose thiamine for 3 days, transitioned to oral -Stable, improved   Myositis -Patient with concern for myositis or early myonecrosis of the left calf during her last admission -Resolved  Left calf pain and swelling -Could have been related to recent myositis -Improving, Dopplers negative for DVT  Tobacco dependence -Encourage cessation.  -This was discussed with the patient and should be reviewed on an ongoing basis.  -Patch ordered at patient request.  Anemia of chronic disease. Monitor serial H&H.  Severe protein calorie malnutrition. -Supplements given  Pressure ulcer  Pressure Injury 03/06/18 Stage II -  Partial thickness loss of dermis presenting as a shallow open ulcer with a red, pink wound bed without slough. scabbed (Active)  03/06/18 1630  Location: Coccyx  Location Orientation: Mid  Staging: Stage II -  Partial thickness loss of dermis presenting as a shallow open ulcer with a red, pink wound bed without slough.  Wound  Description (Comments): scabbed  Present on Admission: Yes      Discharge Exam: Vitals:   09/20/18 1958 09/21/18 0742  BP: 105/71 98/74   Pulse: 70 60  Resp: 16 14  Temp: 99.1 F (37.3 C) 97.9 F (36.6 C)  SpO2: 99% 98%     Discharge Instructions    Allergies as of 09/21/2018      Reactions   Toradol [ketorolac Tromethamine] Other (See Comments)   GI upset   Tramadol Other (See Comments)   GI upset      Medication List    You have not been prescribed any medications.    No Known Allergies Follow-up Information    [facility] Follow up on 10/27/2018.   Specialty:  Family Medicine Why:  9:30 for hospital follow up, will be seeing Dr. Valarie Merino information: [address] [place] [address] [phone]           The results of significant diagnostics from this hospitalization (including imaging, microbiology, ancillary and laboratory) are listed below for reference.    Significant Diagnostic Studies: Ct Abdomen Pelvis Wo Contrast  Result Date: 09/30/2018 CLINICAL DATA:  Ongoing abdominal pain and distension. EXAM: CT ABDOMEN AND PELVIS WITHOUT CONTRAST TECHNIQUE: Multidetector CT imaging of the abdomen and pelvis was performed following the standard protocol without IV contrast. COMPARISON:  CT scan 03/03/2018 FINDINGS: Lower chest: Cardiac enlargement and small bilateral pleural effusions. Vascular congestion and possible mild edema. Hepatobiliary: No focal hepatic lesions are identified without contrast. No biliary dilatation. Gallbladder appears contracted. No obvious common bile duct dilatation. Pancreas: No obvious mass or inflammation without contrast but poor visualization. Spleen: The spleen is enlarged and there is a large new cystic area measuring 10 x 9 x 6 cm. It measures 17 Hounsfield units which suggests a cyst but it was not present on the prior CT scan from 6 months ago. It could be a posttraumatic cyst or possibly a pseudocyst. Adrenals/Urinary Tract: The adrenal glands and kidneys are grossly normal without contrast. No renal calculi or  hydronephrosis. The bladder is grossly normal. Stomach/Bowel: There is contrast scattered throughout the small bowel. No obstruction or mass is identified. Vascular/Lymphatic: No atherosclerotic calcifications or aneurysm. No gross adenopathy. Reproductive: The uterus and ovaries are grossly normal. Other: Mesenteric edema without overt ascites. There is also diffuse body wall edema suggesting anasarca. Musculoskeletal: No significant bony findings. IMPRESSION: 1. Diffuse body wall edema, small bilateral pleural effusions and mesenteric edema suggesting anasarca. 2. New large hepatic cyst since prior CT from June 2019. It could be posttraumatic or possibly a pseudocyst. 3. No obvious acute abdominal/pelvic findings or adenopathy but study is limited due to lack of IV contrast, very little intraperitoneal fat and diffuse mesenteric edema. Electronically Signed   By: Rudie Meyer M.D.   On: 09/30/2018 14:44   Dg Tibia/fibula Left  Result Date: 09/12/2018 CLINICAL DATA:  Bilateral leg pain EXAM: LEFT TIBIA AND FIBULA - 2 VIEW COMPARISON:  None. FINDINGS: There is no evidence of fracture or other focal bone lesions. Soft tissues are unremarkable. No radiographic changes of osteomyelitis. IMPRESSION: Negative. Electronically Signed   By: Charlett Nose M.D.   On: 09/12/2018 12:26   Dg Tibia/fibula Right  Result Date: 09/12/2018 CLINICAL DATA:  Bilateral leg pain EXAM: RIGHT TIBIA AND FIBULA - 2 VIEW COMPARISON:  None. FINDINGS: There is no evidence of fracture or other focal bone lesions. Soft tissues are unremarkable. No radiographic changes of osteomyelitis. IMPRESSION: Negative.  Electronically Signed   By: Charlett NoseKevin  Dover M.D.   On: 09/12/2018 12:26   Ct Angio Chest Pe W And/or Wo Contrast  Result Date: 09/26/2018 CLINICAL DATA:  Chest palpitations and right shoulder pain in an IV drug abuser. History of endocarditis. EXAM: CT ANGIOGRAPHY CHEST WITH CONTRAST TECHNIQUE: Multidetector CT imaging of the chest was  performed using the standard protocol during bolus administration of intravenous contrast. Multiplanar CT image reconstructions and MIPs were obtained to evaluate the vascular anatomy. CONTRAST:  75 mL ISOVUE-370 IOPAMIDOL (ISOVUE-370) INJECTION 76% COMPARISON:  CT chest 07/26/2018. Single view of the chest 09/23/2018 and 09/24/2018. FINDINGS: Cardiovascular: No pulmonary embolus is identified. There is cardiomegaly. No pericardial effusion. No aneurysm. No atherosclerotic vascular disease identified. Mediastinum/Nodes: No enlarged mediastinal, hilar, or axillary lymph nodes. Thyroid gland, trachea, and esophagus demonstrate no significant findings. Lungs/Pleura: Very small right pleural effusion is seen. Focal airspace disease is seen in the periphery of the right middle lobe. Peribronchial thickening is noted. Dependent atelectasis is seen. There is some emphysematous disease. Upper Abdomen: Negative. Musculoskeletal: Negative. Review of the MIP images confirms the above findings. IMPRESSION: Negative for pulmonary embolus. Focal airspace disease in the periphery of the right middle lobe has an appearance most worrisome for pneumonia. Peribronchial thickening compatible with bronchitis also noted. Marked cardiomegaly. Emphysema (ICD10-J43.9). Electronically Signed   By: Drusilla Kannerhomas  Dalessio M.D.   On: 09/26/2018 16:21   Mr Brain Wo Contrast  Result Date: 09/28/2018 CLINICAL DATA:  Headache with endocarditis. EXAM: MRI HEAD WITHOUT CONTRAST TECHNIQUE: Multiplanar, multiecho pulse sequences of the brain and surrounding structures were obtained without intravenous contrast. COMPARISON:  None. FINDINGS: The study is degraded by motion, despite efforts to reduce this artifact, including utilization of motion-resistant MR sequences. The findings of the study are interpreted in the context of reduced sensitivity/specificity. BRAIN: There is no acute infarct, acute hemorrhage, hydrocephalus or extra-axial collection. The  midline structures are normal. No midline shift or other mass effect. Multifocal white matter hyperintensity, most commonly due to chronic ischemic microangiopathy. The cerebral and cerebellar volume are age-appropriate. Susceptibility-sensitive sequences show no chronic microhemorrhage or superficial siderosis. VASCULAR: Major intracranial arterial and venous sinus flow voids are normal. SKULL AND UPPER CERVICAL SPINE: Calvarial bone marrow signal is normal. There is no skull base mass. Visualized upper cervical spine and soft tissues are normal. SINUSES/ORBITS: No fluid levels or advanced mucosal thickening. No mastoid or middle ear effusion. The orbits are normal. IMPRESSION: Motion degraded examination. No acute intracranial abnormality evident. Electronically Signed   By: Deatra RobinsonKevin  Herman M.D.   On: 09/28/2018 17:37   Mr Cervical Spine Wo Contrast  Result Date: 09/21/2018 CLINICAL DATA:  Endocarditis, IV drug abuse.  Neck pain EXAM: MRI CERVICAL SPINE WITHOUT CONTRAST TECHNIQUE: Multiplanar, multisequence MR imaging of the cervical spine was performed. No intravenous contrast was administered. COMPARISON:  MRI cervical spine 03/07/2018 FINDINGS: Alignment: Image quality degraded by extensive motion. Normal alignment.  Mild kyphosis C6-7 similar to the prior MRI. Vertebrae: Negative for fracture or mass.  No bone marrow edema Cord: Limited cord evaluation due to motion.  No gross abnormality Posterior Fossa, vertebral arteries, paraspinal tissues: Negative for mass or fluid collection. No soft tissue edema Air-fluid levels in the maxillary sinus bilaterally. Disc levels: Mild disc degeneration and spurring at C6-7 as noted on the prior study. No significant spinal stenosis. No evidence of discitis/osteomyelitis on  motion degraded imaging. IMPRESSION: Image quality degraded by significant motion No acute abnormality identified. Air-fluid level in bilateral maxillary sinus.  Electronically Signed   By: Marlan Palau M.D.   On: 09/21/2018 14:12   Mr Thoracic Spine Wo Contrast  Result Date: 09/28/2018 CLINICAL DATA:  Endocarditis with headache and back pain. EXAM: MRI THORACIC AND LUMBAR SPINE WITHOUT CONTRAST TECHNIQUE: Multiplanar and multiecho pulse sequences of the thoracic and lumbar spine were obtained without intravenous contrast. COMPARISON:  None. FINDINGS: The examination had to be discontinued prior to completion due to lack of patient cooperation. Sagittal T2-weighted imaging and STIR sequence were obtained of the thoracic and lumbar spine. MRI THORACIC SPINE FINDINGS Alignment:  Physiologic. Vertebrae: No fracture, evidence of discitis, or bone lesion. Cord:  Normal signal and morphology. Paraspinal and other soft tissues: Negative. Disc levels: The thoracic spinal canal stenosis. There is a disc bulge at C6-7 that narrows the ventral thecal sac. MRI LUMBAR SPINE FINDINGS Segmentation:  Standard. Alignment:  Physiologic. Vertebrae:  No fracture, evidence of discitis, or bone lesion. Conus medullaris and cauda equina: Conus extends to the L1 level. Conus and cauda equina appear normal. Paraspinal and other soft tissues: Negative. Disc levels: No spinal canal stenosis. IMPRESSION: 1. Truncated and motion degraded examination. 2. No epidural collection within the thoracic or lumbar spine. 3. No discitis-osteomyelitis. Electronically Signed   By: Deatra Robinson M.D.   On: 09/28/2018 17:42   Mr Lumbar Spine Wo Contrast  Result Date: 09/28/2018 CLINICAL DATA:  Endocarditis with headache and back pain. EXAM: MRI THORACIC AND LUMBAR SPINE WITHOUT CONTRAST TECHNIQUE: Multiplanar and multiecho pulse sequences of the thoracic and lumbar spine were obtained without intravenous contrast. COMPARISON:  None. FINDINGS: The examination had to be discontinued prior to completion due to lack of patient cooperation. Sagittal T2-weighted imaging and STIR sequence were obtained of the thoracic and lumbar spine. MRI THORACIC  SPINE FINDINGS Alignment:  Physiologic. Vertebrae: No fracture, evidence of discitis, or bone lesion. Cord:  Normal signal and morphology. Paraspinal and other soft tissues: Negative. Disc levels: The thoracic spinal canal stenosis. There is a disc bulge at C6-7 that narrows the ventral thecal sac. MRI LUMBAR SPINE FINDINGS Segmentation:  Standard. Alignment:  Physiologic. Vertebrae:  No fracture, evidence of discitis, or bone lesion. Conus medullaris and cauda equina: Conus extends to the L1 level. Conus and cauda equina appear normal. Paraspinal and other soft tissues: Negative. Disc levels: No spinal canal stenosis. IMPRESSION: 1. Truncated and motion degraded examination. 2. No epidural collection within the thoracic or lumbar spine. 3. No discitis-osteomyelitis. Electronically Signed   By: Deatra Robinson M.D.   On: 09/28/2018 17:42   Mr Shoulder Right Wo Contrast  Result Date: 09/21/2018 CLINICAL DATA:  Right shoulder pain. EXAM: MRI OF THE RIGHT SHOULDER WITHOUT CONTRAST TECHNIQUE: Multiplanar, multisequence MR imaging of the shoulder was performed. No intravenous contrast was administered. COMPARISON:  MRI dated 03/05/2018 FINDINGS: Rotator cuff:  Intact. Muscles: No atrophy or edema in the muscles. There is some edema in the soft tissues around the muscles of the rotator cuff extending onto the chest wall. Biceps long head:  Properly located and intact. Acromioclavicular Joint:  Normal.  Type 1 acromion.  No bursitis. Glenohumeral Joint: Minimal effusion.  No chondral defect. Labrum:  Intact. Bones: No evidence of osteomyelitis or fracture or other significant bone abnormality. Other: None IMPRESSION: 1. Minimal glenohumeral joint effusion, almost completely resolved since the prior MRI. 2. Slight residual edema in the subcutaneous fat around the shoulder joint, markedly diminished since the prior study. 3. No discrete osteomyelitis or soft tissue abscesses. 4. Intact rotator cuff.  The Electronically  Signed   By: Francene Boyers M.D.   On: 09/21/2018 14:47   Vas [place] Lower Extremity Venous (dvt)  Result Date: 09/14/2018  Lower Venous Study Indications: Pain.  Performing Technologist: Jeb Levering RDMS, RVT  Examination Guidelines: A complete evaluation includes B-mode imaging, spectral Doppler, color Doppler, and power Doppler as needed of all accessible portions of each vessel. Bilateral testing is considered an integral part of a complete examination. Limited examinations for reoccurring indications may be performed as noted.  Right Venous Findings: +---------+---------------+---------+-----------+----------+-------+          CompressibilityPhasicitySpontaneityPropertiesSummary +---------+---------------+---------+-----------+----------+-------+ CFV      Full           Yes      Yes                          +---------+---------------+---------+-----------+----------+-------+ SFJ      Full                                                 +---------+---------------+---------+-----------+----------+-------+ FV Prox  Full                                                 +---------+---------------+---------+-----------+----------+-------+ FV Mid   Full                                                 +---------+---------------+---------+-----------+----------+-------+ FV DistalFull                                                 +---------+---------------+---------+-----------+----------+-------+ PFV      Full                                                 +---------+---------------+---------+-----------+----------+-------+ POP      Full           Yes      Yes                          +---------+---------------+---------+-----------+----------+-------+ PTV      Full                                                 +---------+---------------+---------+-----------+----------+-------+ PERO     Full                                                  +---------+---------------+---------+-----------+----------+-------+  Left Venous Findings: +---------+---------------+---------+-----------+----------+-------+          CompressibilityPhasicitySpontaneityPropertiesSummary +---------+---------------+---------+-----------+----------+-------+  CFV      Full           Yes      Yes                          +---------+---------------+---------+-----------+----------+-------+ SFJ      Full                                                 +---------+---------------+---------+-----------+----------+-------+ FV Prox  Full                                                 +---------+---------------+---------+-----------+----------+-------+ FV Mid   Full                                                 +---------+---------------+---------+-----------+----------+-------+ FV DistalFull                                                 +---------+---------------+---------+-----------+----------+-------+ PFV      Full                                                 +---------+---------------+---------+-----------+----------+-------+ POP      Full           Yes      Yes                          +---------+---------------+---------+-----------+----------+-------+ PTV      Full                                                 +---------+---------------+---------+-----------+----------+-------+ PERO     Full                                                 +---------+---------------+---------+-----------+----------+-------+    Summary: Right: There is no evidence of deep vein thrombosis in the lower extremity. No cystic structure found in the popliteal fossa. Left: There is no evidence of deep vein thrombosis in the lower extremity. No cystic structure found in the popliteal fossa.  *See table(s) above for measurements and observations. Electronically signed by Coral Else MD on 09/14/2018 at 6:00:00 PM.    Final    [place] Ekg  Site Rite  Result Date: 09/15/2018 If Site Rite image not attached, placement could not be confirmed due to current cardiac rhythm.   Microbiology: Recent Results (from the past 240 hour(s))  Blood culture (routine x 2)     Status: None  Collection Time: 09/26/18  2:07 PM  Result Value Ref Range Status   Specimen Description BLOOD RIGHT HAND  Final   Special Requests   Final    BOTTLES DRAWN AEROBIC AND ANAEROBIC Blood Culture adequate volume   Culture   Final    NO GROWTH 5 DAYS Performed at [facility], [address], [place], [place] [address]    Report Status 10/01/2018 FINAL  Final  Blood culture (routine x 2)     Status: None   Collection Time: 09/26/18  2:46 PM  Result Value Ref Range Status   Specimen Description BLOOD RIGHT ANTECUBITAL  Final   Special Requests   Final    BOTTLES DRAWN AEROBIC AND ANAEROBIC Blood Culture results may not be optimal due to an inadequate volume of blood received in culture bottles   Culture   Final    NO GROWTH 5 DAYS Performed at [facility], [address], [place], [place] [address]    Report Status 10/01/2018 FINAL  Final     Labs: Basic Metabolic Panel: Recent Labs  Lab 09/26/18 1407 09/26/18 1428 09/26/18 2311 09/28/18 0512  NA 142 142  --  140  K 3.7 3.7  --  3.8  CL 110 110  --  106  CO2 22  --   --  25  GLUCOSE 91 88  --  103*  BUN 14 18  --  17  CREATININE 0.42* 0.30* 0.67 0.63  CALCIUM 8.6*  --   --  8.3*   Liver Function Tests: No results for input(s): AST, ALT, ALKPHOS, BILITOT, PROT, ALBUMIN in the last 168 hours. No results for input(s): LIPASE, AMYLASE in the last 168 hours. No results for input(s): AMMONIA in the last 168 hours. CBC: Recent Labs  Lab 09/26/18 1406 09/26/18 1428 09/26/18 2311 09/28/18 0512  WBC 7.8  --  6.9 9.7  NEUTROABS 4.8  --   --   --   HGB 8.5* 10.5* 8.7* 9.2*  HCT 29.6* 31.0* 29.7* 31.1*  MCV 99.7  --  99.3 101.6*  PLT 366  --  362 354   Cardiac Enzymes: No  results for input(s): CKTOTAL, CKMB, CKMBINDEX, TROPONINI in the last 168 hours. BNP: BNP (last 3 results) No results for input(s): BNP in the last 8760 hours.  ProBNP (last 3 results) No results for input(s): PROBNP in the last 8760 hours.  CBG: No results for input(s): GLUCAP in the last 168 hours.     Signed:  Zannie Cove MD.  [facility] 10/02/2018, 4:37 PM

## 2018-10-18 DIAGNOSIS — E46 Unspecified protein-calorie malnutrition: Secondary | ICD-10-CM

## 2018-10-18 DIAGNOSIS — F1910 Other psychoactive substance abuse, uncomplicated: Secondary | ICD-10-CM

## 2018-10-18 DIAGNOSIS — E876 Hypokalemia: Secondary | ICD-10-CM

## 2018-10-18 DIAGNOSIS — Z720 Tobacco use: Secondary | ICD-10-CM

## 2018-10-18 DIAGNOSIS — R7881 Bacteremia: Secondary | ICD-10-CM

## 2018-10-18 DIAGNOSIS — I38 Endocarditis, valve unspecified: Secondary | ICD-10-CM

## 2018-10-27 ENCOUNTER — Inpatient Hospital Stay (INDEPENDENT_AMBULATORY_CARE_PROVIDER_SITE_OTHER): Payer: Medicaid Other | Admitting: Primary Care

## 2018-11-08 ENCOUNTER — Inpatient Hospital Stay: Payer: Medicaid Other | Admitting: Internal Medicine

## 2018-11-30 DIAGNOSIS — I340 Nonrheumatic mitral (valve) insufficiency: Secondary | ICD-10-CM

## 2018-11-30 DIAGNOSIS — R9431 Abnormal electrocardiogram [ECG] [EKG]: Secondary | ICD-10-CM

## 2018-11-30 DIAGNOSIS — I361 Nonrheumatic tricuspid (valve) insufficiency: Secondary | ICD-10-CM

## 2018-11-30 DIAGNOSIS — F1910 Other psychoactive substance abuse, uncomplicated: Secondary | ICD-10-CM

## 2018-11-30 DIAGNOSIS — E876 Hypokalemia: Secondary | ICD-10-CM

## 2018-11-30 DIAGNOSIS — I38 Endocarditis, valve unspecified: Secondary | ICD-10-CM

## 2018-11-30 DIAGNOSIS — Z720 Tobacco use: Secondary | ICD-10-CM

## 2019-07-02 DIAGNOSIS — F1123 Opioid dependence with withdrawal: Secondary | ICD-10-CM

## 2019-07-02 DIAGNOSIS — R112 Nausea with vomiting, unspecified: Secondary | ICD-10-CM

## 2019-07-02 DIAGNOSIS — F1990 Other psychoactive substance use, unspecified, uncomplicated: Secondary | ICD-10-CM

## 2019-07-02 DIAGNOSIS — R748 Abnormal levels of other serum enzymes: Secondary | ICD-10-CM

## 2019-12-12 IMAGING — MR MR LUMBAR SPINE W/O CM
11 of 14 series · 35 of 48 positions shown · IV contrast (Contrast agent)
Comparison: None.

CLINICAL DATA: Endocarditis with headache and back pain.

EXAM:
MRI THORACIC AND LUMBAR SPINE WITHOUT CONTRAST
TECHNIQUE: Multiplanar and multiecho pulse sequences of the thoracic and lumbar
spine were obtained without intravenous contrast.

[Series 5: DWI · axial · 3.0mm · 0.88mm/px · z∈[-113,-2]mm · 8 of 94 slices shown (1 of 4)]
[im 1/94]
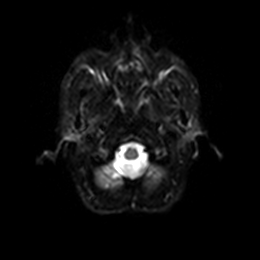
[im 11/94]
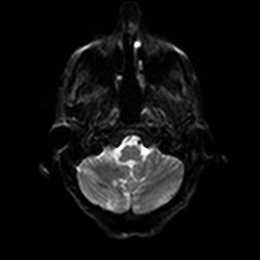
[im 32/94]
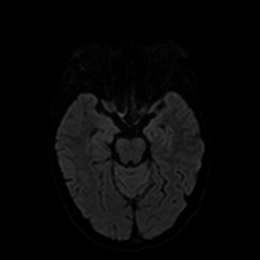
[im 42/94]
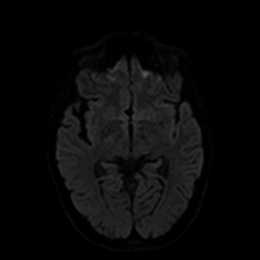
[im 52/94]
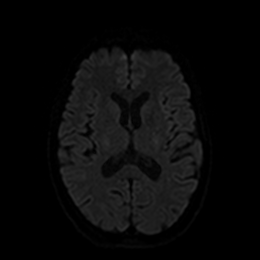
[im 63/94]
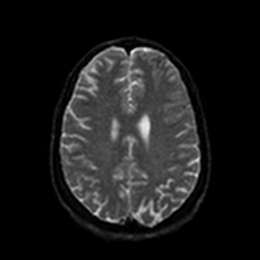
[im 83/94]
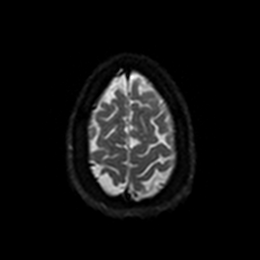
[im 94/94]
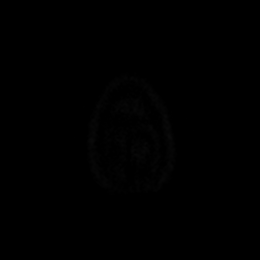

[Series 6: DWI · axial · 3.0mm · 0.88mm/px · z∈[-113,-2]mm · 4 of 47 slices shown (2 of 4)]
[im 1/47]
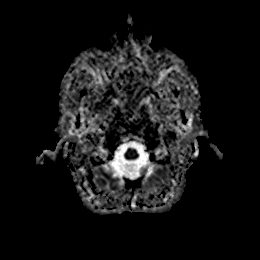
[im 16/47]
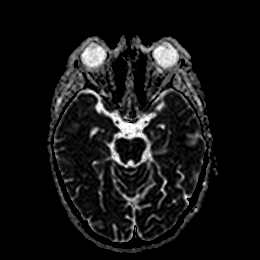
[im 31/47]
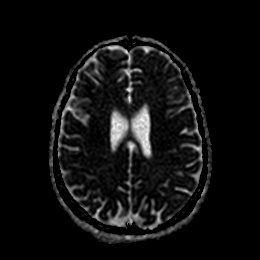
[im 47/47]
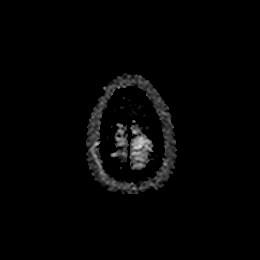

[Series 7: DWI · coronal · 4.0mm · 0.88mm/px · 7 of 64 slices shown (3 of 4)]
[im 1/64]
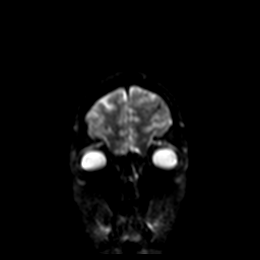
[im 11/64]
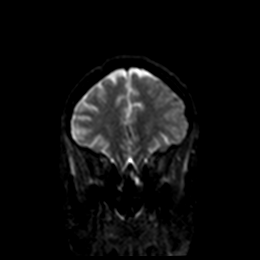
[im 22/64]
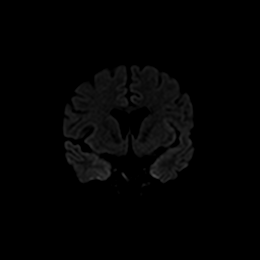
[im 32/64]
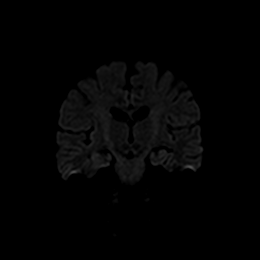
[im 43/64]
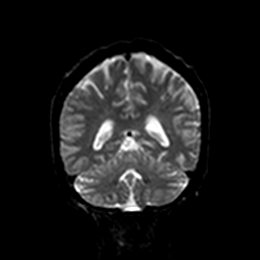
[im 53/64]
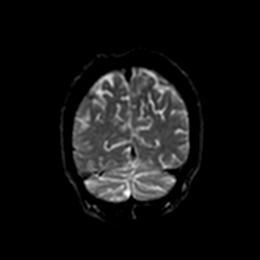
[im 64/64]
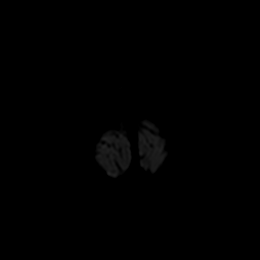

[Series 8: DWI · coronal · 4.0mm · 0.88mm/px · 2 of 32 slices shown (4 of 4)]
[im 1/32]
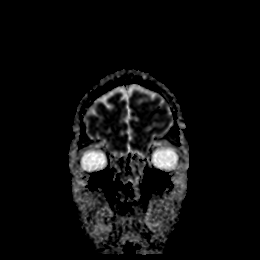
[im 11/32]
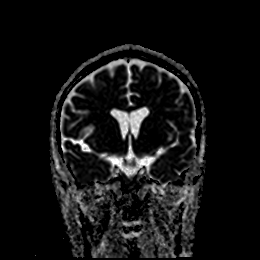

[Series 21: T2 · sagittal · 3.0mm · 0.76mm/px · 2 of 17 slices shown (1 of 4)]
[im 1/17]
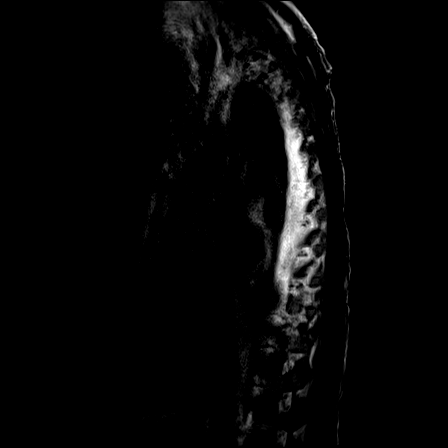
[im 17/17]
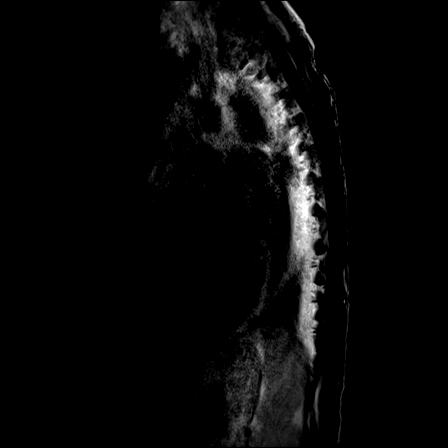

[Series 24: T1 · sagittal · 6.0mm · 1.23mm/px · 1 of 9 slices shown]
[im 1/9]
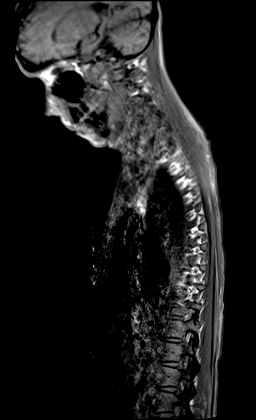

[Series 25: T2 · sagittal · 4.0mm · 0.73mm/px · 2 of 16 slices shown (2 of 4)]
[im 1/16]
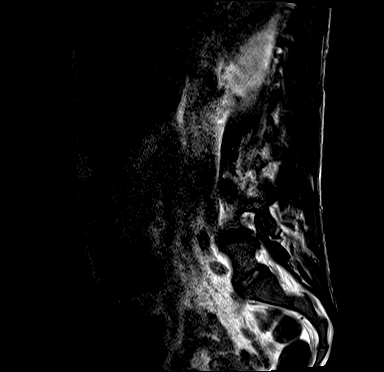
[im 16/16]
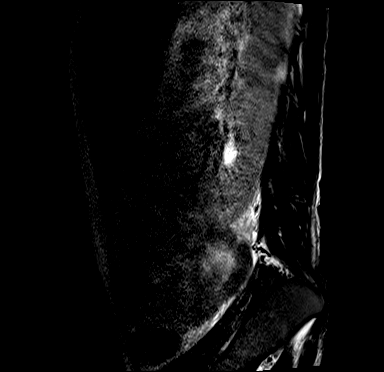

[Series 30: STIR · sagittal · 3.0mm · 0.38mm/px · 2 of 17 slices shown (1 of 2)]
[im 1/17]
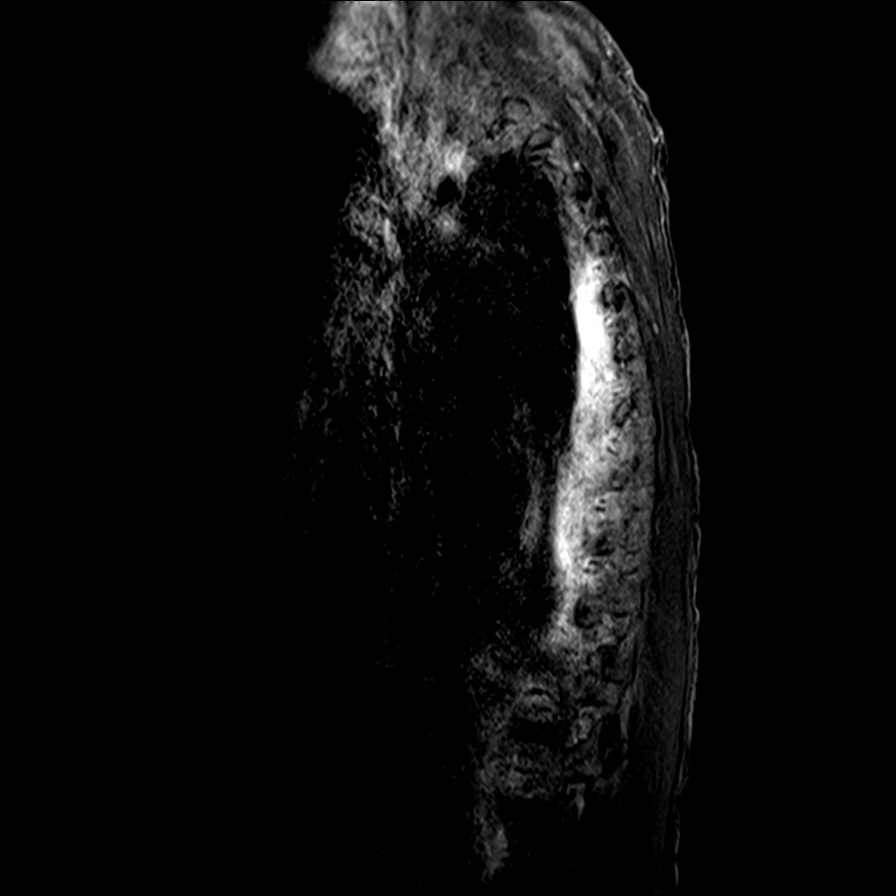
[im 17/17]
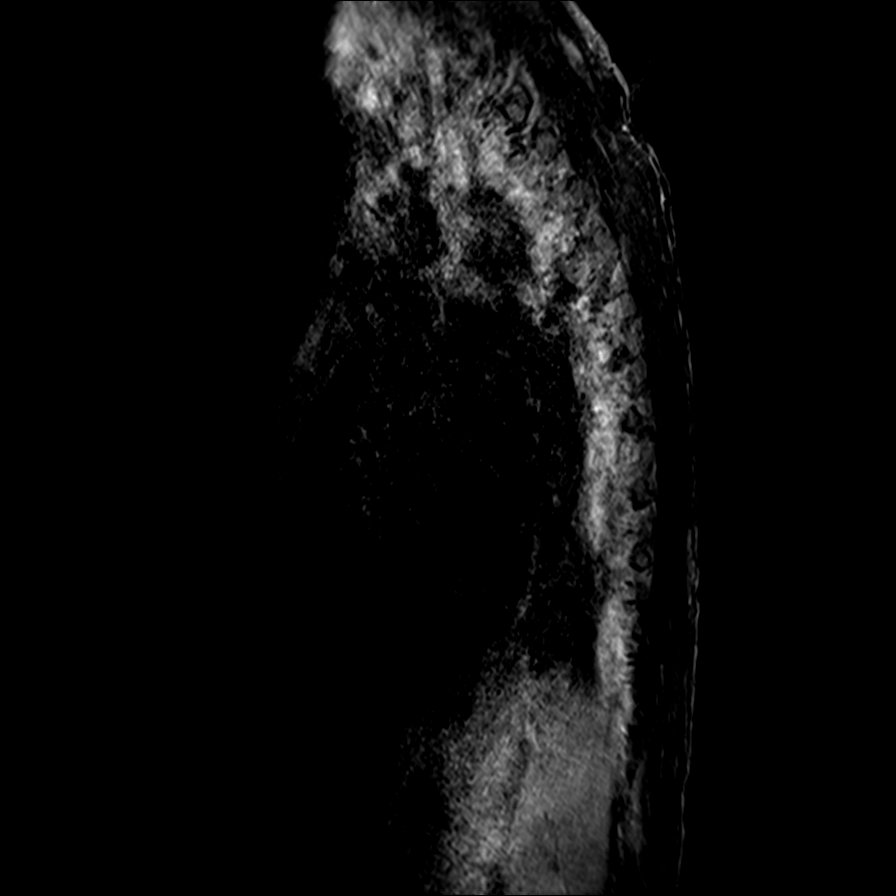

[Series 31: STIR · sagittal · 4.0mm · 0.55mm/px · 2 of 16 slices shown (2 of 2)]
[im 1/16]
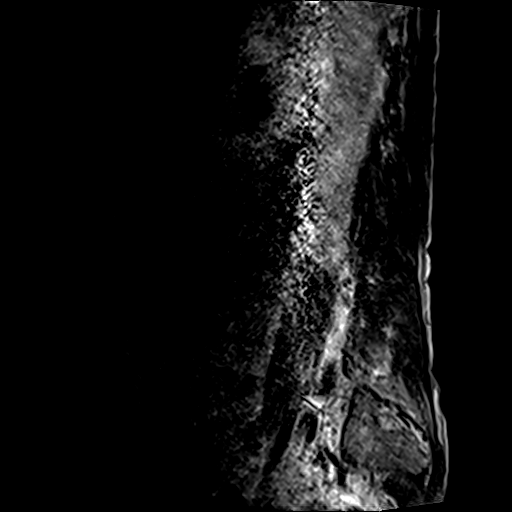
[im 16/16]
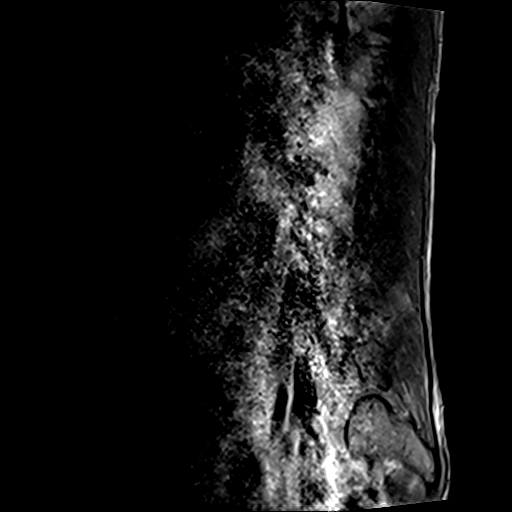

[Series 32: T2 · sagittal · 4.0mm · 0.73mm/px · 2 of 16 slices shown (3 of 4)]
[im 1/16]
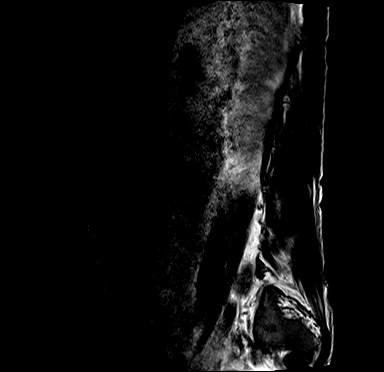
[im 16/16]
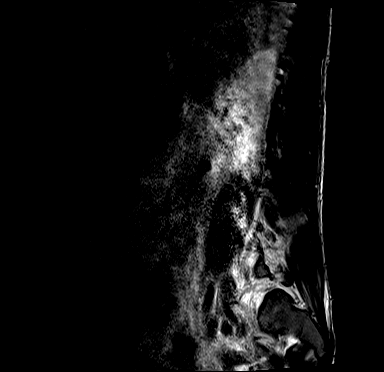

[Series 33: T2 · axial · 5.0mm · 0.72mm/px · z∈[-110,+7]mm · 3 of 25 slices shown (4 of 4)]
[im 1/25]
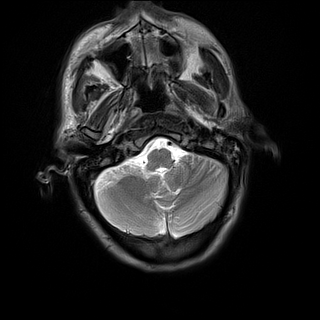
[im 13/25]
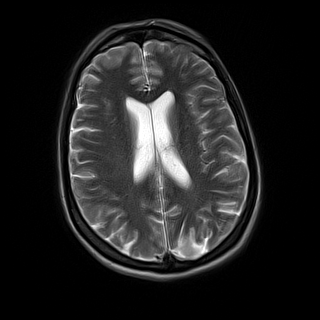
[im 25/25]
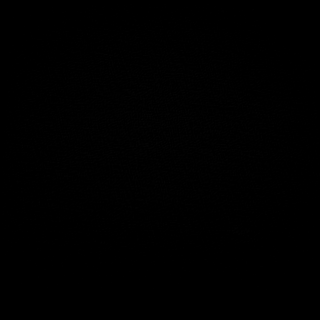

[35 of 48 positions shown; findings below may reference images not displayed]

FINDINGS: The examination had to be discontinued prior to completion due to
lack of patient cooperation. Sagittal T2-weighted imaging and STIR
sequence were obtained of the thoracic and lumbar spine.

MRI THORACIC SPINE FINDINGS

Alignment:  Physiologic.

Vertebrae: No fracture, evidence of discitis, or bone lesion.

Cord:  Normal signal and morphology.

Paraspinal and other soft tissues: Negative.

Disc levels:

The thoracic spinal canal stenosis. There is a disc bulge at C6-7
that narrows the ventral thecal sac.

MRI LUMBAR SPINE FINDINGS

Segmentation:  Standard.

Alignment:  Physiologic.

Vertebrae:  No fracture, evidence of discitis, or bone lesion.

Conus medullaris and cauda equina: Conus extends to the L1 level.
Conus and cauda equina appear normal.

Paraspinal and other soft tissues: Negative.

Disc levels:

No spinal canal stenosis.
IMPRESSION: 1. Truncated and motion degraded examination.
2. No epidural collection within the thoracic or lumbar spine.
3. No discitis-osteomyelitis.

## 2020-04-24 DIAGNOSIS — I361 Nonrheumatic tricuspid (valve) insufficiency: Secondary | ICD-10-CM

## 2020-04-24 DIAGNOSIS — I340 Nonrheumatic mitral (valve) insufficiency: Secondary | ICD-10-CM

## 2020-05-03 DIAGNOSIS — I63411 Cerebral infarction due to embolism of right middle cerebral artery: Secondary | ICD-10-CM
